# Patient Record
Sex: Female | Born: 1975 | Race: White | Hispanic: No | Marital: Married | Smoking: Light tobacco smoker
Health system: Southern US, Community
[De-identification: ages and names within clinical notes are randomized; demographics above are authoritative.]

## PROBLEM LIST (undated history)

## (undated) DIAGNOSIS — N6320 Unspecified lump in the left breast, unspecified quadrant: Secondary | ICD-10-CM

## (undated) DIAGNOSIS — E0590 Thyrotoxicosis, unspecified without thyrotoxic crisis or storm: Secondary | ICD-10-CM

## (undated) DIAGNOSIS — Z1371 Encounter for nonprocreative screening for genetic disease carrier status: Secondary | ICD-10-CM

## (undated) DIAGNOSIS — K219 Gastro-esophageal reflux disease without esophagitis: Secondary | ICD-10-CM

## (undated) DIAGNOSIS — Z803 Family history of malignant neoplasm of breast: Secondary | ICD-10-CM

## (undated) DIAGNOSIS — E559 Vitamin D deficiency, unspecified: Secondary | ICD-10-CM

## (undated) HISTORY — DX: Family history of malignant neoplasm of breast: Z80.3

## (undated) HISTORY — DX: Vitamin D deficiency, unspecified: E55.9

## (undated) HISTORY — DX: Thyrotoxicosis, unspecified without thyrotoxic crisis or storm: E05.90

## (undated) HISTORY — PX: BUNIONECTOMY: SHX129

## (undated) HISTORY — DX: Encounter for nonprocreative screening for genetic disease carrier status: Z13.71

## (undated) HISTORY — PX: BREAST EXCISIONAL BIOPSY: SUR124

## (undated) HISTORY — DX: Unspecified lump in the left breast, unspecified quadrant: N63.20

---

## 2004-09-15 ENCOUNTER — Emergency Department: Payer: Self-pay | Admitting: Emergency Medicine

## 2004-09-15 ENCOUNTER — Other Ambulatory Visit: Payer: Self-pay

## 2005-02-14 ENCOUNTER — Emergency Department: Payer: Self-pay | Admitting: Emergency Medicine

## 2005-04-29 ENCOUNTER — Inpatient Hospital Stay: Payer: Self-pay

## 2006-07-18 ENCOUNTER — Emergency Department: Payer: Self-pay | Admitting: Emergency Medicine

## 2009-07-10 ENCOUNTER — Encounter (HOSPITAL_COMMUNITY): Admission: RE | Admit: 2009-07-10 | Discharge: 2009-08-07 | Payer: Self-pay | Admitting: Endocrinology

## 2011-02-12 LAB — HCG, SERUM, QUALITATIVE: Preg, Serum: NEGATIVE

## 2014-11-05 ENCOUNTER — Ambulatory Visit: Payer: Self-pay | Admitting: Podiatry

## 2016-11-08 DIAGNOSIS — N6320 Unspecified lump in the left breast, unspecified quadrant: Secondary | ICD-10-CM

## 2016-11-08 HISTORY — DX: Unspecified lump in the left breast, unspecified quadrant: N63.20

## 2017-01-10 ENCOUNTER — Ambulatory Visit (INDEPENDENT_AMBULATORY_CARE_PROVIDER_SITE_OTHER): Payer: 59 | Admitting: Obstetrics and Gynecology

## 2017-01-10 ENCOUNTER — Encounter: Payer: Self-pay | Admitting: Obstetrics and Gynecology

## 2017-01-10 VITALS — BP 100/60 | HR 77 | Ht 65.0 in | Wt 144.0 lb

## 2017-01-10 DIAGNOSIS — Z1231 Encounter for screening mammogram for malignant neoplasm of breast: Secondary | ICD-10-CM

## 2017-01-10 DIAGNOSIS — Z1239 Encounter for other screening for malignant neoplasm of breast: Secondary | ICD-10-CM

## 2017-01-10 DIAGNOSIS — Z803 Family history of malignant neoplasm of breast: Secondary | ICD-10-CM | POA: Diagnosis not present

## 2017-01-10 DIAGNOSIS — N63 Unspecified lump in unspecified breast: Secondary | ICD-10-CM | POA: Diagnosis not present

## 2017-01-10 DIAGNOSIS — N644 Mastodynia: Secondary | ICD-10-CM

## 2017-01-10 NOTE — Progress Notes (Signed)
HPI: Pt presents for LT breast pain and mass for the past 3 days. The pain was severe, burning and sharp initially, but it has improved since. She then noticed a mass in the area yesterday. She denies any erythema, nipple d/c, trauma. She has a FH of breast cancer in her PGM and possibly in mat aunts, but is unsure. She has never had a mammo.    Review of Systems  Constitutional: Negative.   Skin: Negative.   Breast: mass, tenderness, no erythema/lesions  Vitals:   01/10/17 0949  BP: 100/60  Pulse: 77    Physical Exam  Constitutional: She appears well-developed.  Pulmonary/Chest: Left breast exhibits mass and tenderness.    Neurological: She is alert.  Vitals reviewed.     Assessment/Plan:  Breast mass - LT breast with 1.5 x 2 cm firm, mobile mass at 3:00 pos. Check dx mammo bilat and u/s.  - Plan: MM Digital Diagnostic Bilat, US BREAST LTD UNI LEFT INC AXILLA  Breast pain - Plan: MM Digital Diagnostic Bilat, US BREAST LTD UNI LEFT INC AXILLA  Screening for breast cancer - Plan: US BREAST LTD UNI RIGHT INC AXILLA  Family history of breast cancer - Pt to clarify mat FH of breast cancer and f/u next month at annual for BRCA testing review.  Return in about 6 weeks (around 02/21/2017), or if symptoms worsen or fail to improve, for annual.   Ardeth Perfect, PA-C 06/16/1693 6:56 PM

## 2017-01-11 ENCOUNTER — Telehealth: Payer: Self-pay | Admitting: Obstetrics and Gynecology

## 2017-01-11 NOTE — Telephone Encounter (Signed)
Patient is aware of appt at [facility] [place] on Friday, 01/14/17, 8:50am arrival for 9:10am appt. Patient was given the address and directions.

## 2017-01-11 NOTE — Telephone Encounter (Signed)
-----   Message from Chad Cordial, PA-C sent at 01/10/2017 10:12 AM EST ----- Dx mammo and u/s LT breast for 3:00 mass. Prefers AM appt. Send to [place] if can't get into [facility] in next wk. Thx.

## 2017-01-14 ENCOUNTER — Ambulatory Visit
Admission: RE | Admit: 2017-01-14 | Discharge: 2017-01-14 | Disposition: A | Discharge status: Home / Self Care | Payer: 59 | Source: Ambulatory Visit | Attending: Obstetrics and Gynecology | Admitting: Obstetrics and Gynecology

## 2017-01-14 ENCOUNTER — Other Ambulatory Visit: Payer: Self-pay | Admitting: Obstetrics and Gynecology

## 2017-01-14 ENCOUNTER — Encounter: Payer: Self-pay | Admitting: Obstetrics and Gynecology

## 2017-01-14 DIAGNOSIS — N63 Unspecified lump in unspecified breast: Secondary | ICD-10-CM

## 2017-01-14 DIAGNOSIS — N644 Mastodynia: Secondary | ICD-10-CM

## 2017-01-14 DIAGNOSIS — D242 Benign neoplasm of left breast: Secondary | ICD-10-CM

## 2017-01-17 ENCOUNTER — Other Ambulatory Visit: Payer: Self-pay | Admitting: Obstetrics and Gynecology

## 2017-01-17 ENCOUNTER — Telehealth: Payer: Self-pay | Admitting: Obstetrics and Gynecology

## 2017-01-17 DIAGNOSIS — N6320 Unspecified lump in the left breast, unspecified quadrant: Secondary | ICD-10-CM

## 2017-01-17 NOTE — Telephone Encounter (Signed)
Patient is aware of 07/12/17 9:00am appt at the [facility] w/ 8:40am check-in.

## 2017-02-14 ENCOUNTER — Ambulatory Visit: Payer: 59 | Admitting: Obstetrics and Gynecology

## 2017-04-13 ENCOUNTER — Ambulatory Visit: Payer: 59 | Admitting: Obstetrics and Gynecology

## 2017-07-12 ENCOUNTER — Inpatient Hospital Stay: Admission: RE | Admit: 2017-07-12 | Payer: 59 | Source: Ambulatory Visit

## 2017-07-26 ENCOUNTER — Inpatient Hospital Stay: Admission: RE | Admit: 2017-07-26 | Payer: Self-pay | Source: Ambulatory Visit

## 2017-11-08 DIAGNOSIS — Z1371 Encounter for nonprocreative screening for genetic disease carrier status: Secondary | ICD-10-CM

## 2017-11-08 DIAGNOSIS — Z803 Family history of malignant neoplasm of breast: Secondary | ICD-10-CM

## 2017-11-08 HISTORY — DX: Family history of malignant neoplasm of breast: Z80.3

## 2017-11-08 HISTORY — DX: Encounter for nonprocreative screening for genetic disease carrier status: Z13.71

## 2017-12-05 ENCOUNTER — Encounter: Payer: Self-pay | Admitting: Obstetrics and Gynecology

## 2017-12-05 ENCOUNTER — Ambulatory Visit (INDEPENDENT_AMBULATORY_CARE_PROVIDER_SITE_OTHER): Payer: 59 | Admitting: Obstetrics and Gynecology

## 2017-12-05 VITALS — BP 118/74 | Ht 64.0 in | Wt 175.0 lb

## 2017-12-05 DIAGNOSIS — Z01419 Encounter for gynecological examination (general) (routine) without abnormal findings: Secondary | ICD-10-CM | POA: Diagnosis not present

## 2017-12-05 DIAGNOSIS — Z808 Family history of malignant neoplasm of other organs or systems: Secondary | ICD-10-CM | POA: Diagnosis not present

## 2017-12-05 DIAGNOSIS — D242 Benign neoplasm of left breast: Secondary | ICD-10-CM

## 2017-12-05 DIAGNOSIS — Z1151 Encounter for screening for human papillomavirus (HPV): Secondary | ICD-10-CM

## 2017-12-05 DIAGNOSIS — Z803 Family history of malignant neoplasm of breast: Secondary | ICD-10-CM | POA: Diagnosis not present

## 2017-12-05 DIAGNOSIS — Z1231 Encounter for screening mammogram for malignant neoplasm of breast: Secondary | ICD-10-CM | POA: Diagnosis not present

## 2017-12-05 DIAGNOSIS — R928 Other abnormal and inconclusive findings on diagnostic imaging of breast: Secondary | ICD-10-CM | POA: Diagnosis not present

## 2017-12-05 DIAGNOSIS — Z713 Dietary counseling and surveillance: Secondary | ICD-10-CM

## 2017-12-05 DIAGNOSIS — Z124 Encounter for screening for malignant neoplasm of cervix: Secondary | ICD-10-CM

## 2017-12-05 DIAGNOSIS — Z1239 Encounter for other screening for malignant neoplasm of breast: Secondary | ICD-10-CM

## 2017-12-05 MED ORDER — PHENTERMINE HCL 37.5 MG PO TABS: 37.5000 mg | ORAL_TABLET | Freq: Every day | ORAL | 1 refills | Status: DC

## 2017-12-05 NOTE — Patient Instructions (Signed)
I value your feedback and entrusting us with your care. If you get a [facility] patient survey, I would appreciate you taking the time to let us know about your experience today. Thank you! 

## 2017-12-05 NOTE — Progress Notes (Signed)
PCP:  Patient, No Pcp Per   Chief Complaint  Patient presents with  . Annual Exam     HPI:      [name] is a 42 y.o. [phone] who LMP was Patient's last menstrual period was 11/10/2017., presents today for her annual examination.  Her menses are regular every 28-30 days, lasting 3 days.  Dysmenorrhea mild, occurring first 1-2 days of flow. She does not have intermenstrual bleeding.  Sex activity: single partner, contraception - vasectomy.  Last Pap: December 04, 2014  Results were: no abnormalities /neg HPV DNA  Hx of STDs: none  Last mammogram: January 14, 2017  Results were: birads 3 for LT breast, probable fibroadenoma. Repeat u/s due in 6 months but pt never had it done. Due for yearly mammo 3/19.  There is a FH of breast cancer in her PGM, genetic testing not done. There is no FH of ovarian cancer. The patient does do self-breast exams.  Tobacco use: The patient denies current or previous tobacco use. Alcohol use: social drinker No drug use.  Exercise: minimally active  She does get adequate calcium and Vitamin D in her diet.  Pt stopped smoking last yr and has gained wt since then. She is really upset about her current weight. She has tried keto diet but would like to try phentermine again to help her "jump start" wt loss. She did it in past for a few months with some wt loss.    Past Medical History:  Diagnosis Date  . Hyperthyroidism   . Pre-diabetes   . Vitamin D deficiency     Past Surgical History:  Procedure Laterality Date  . BUNIONECTOMY    . COSMETIC SURGERY      Family History  Problem Relation Age of Onset  . Lung cancer Maternal Grandmother   . Lung cancer Maternal Grandfather   . Breast cancer Paternal Grandmother 53  . Lung cancer Paternal Grandmother   . Diabetes Paternal Grandfather     Social History   Socioeconomic History  . Marital status: Married    Spouse name: Not on file  . Number of children: Not on file  . Years of  education: Not on file  . Highest education level: Not on file  Social Needs  . Financial resource strain: Not on file  . Food insecurity - worry: Not on file  . Food insecurity - inability: Not on file  . Transportation needs - medical: Not on file  . Transportation needs - non-medical: Not on file  Occupational History  . Not on file  Tobacco Use  . Smoking status: Former Research scientist (life sciences)  . Smokeless tobacco: Never Used  . Tobacco comment: quit 07/2017  Substance and Sexual Activity  . Alcohol use: Yes  . Drug use: No  . Sexual activity: Yes    Birth control/protection: Surgical    Comment: vasectomy  Other Topics Concern  . Not on file  Social History Narrative  . Not on file    Current Meds  Medication Sig  . levothyroxine (SYNTHROID, LEVOTHROID) 125 MCG tablet Take by mouth.  Marland Kitchen omeprazole (PRILOSEC) 40 MG capsule      ROS:  Review of Systems  Constitutional: Negative for fatigue, fever and unexpected weight change.  Respiratory: Negative for cough, shortness of breath and wheezing.   Cardiovascular: Negative for chest pain, palpitations and leg swelling.  Gastrointestinal: Positive for constipation. Negative for blood in stool, diarrhea, nausea and vomiting.  Endocrine: Negative for cold  intolerance, heat intolerance and polyuria.  Genitourinary: Negative for dyspareunia, dysuria, flank pain, frequency, genital sores, hematuria, menstrual problem, pelvic pain, urgency, vaginal bleeding, vaginal discharge and vaginal pain.  Musculoskeletal: Negative for back pain, joint swelling and myalgias.  Skin: Negative for rash.  Neurological: Negative for dizziness, syncope, light-headedness, numbness and headaches.  Hematological: Negative for adenopathy.  Psychiatric/Behavioral: Negative for agitation, confusion, sleep disturbance and suicidal ideas. The patient is not nervous/anxious.      Objective: BP 118/74   Ht 5\' 4"  (1.626 m)   Wt 175 lb (79.4 kg)   LMP 11/10/2017   BMI  30.04 kg/m    Physical Exam  Constitutional: She is oriented to person, place, and time. She appears well-developed and well-nourished.  Genitourinary: Vagina normal and uterus normal. There is no rash or tenderness on the right labia. There is no rash or tenderness on the left labia. No erythema or tenderness in the vagina. No vaginal discharge found. Right adnexum does not display mass and does not display tenderness. Left adnexum does not display mass and does not display tenderness. Cervix does not exhibit motion tenderness or polyp. Uterus is not enlarged or tender.  Neck: Normal range of motion. No thyromegaly present.  Cardiovascular: Normal rate, regular rhythm and normal heart sounds.  No murmur heard. Pulmonary/Chest: Effort normal and breath sounds normal. Right breast exhibits no mass, no nipple discharge, no skin change and no tenderness. Left breast exhibits no mass, no nipple discharge, no skin change and no tenderness.  Abdominal: Soft. There is no tenderness. There is no guarding.  Musculoskeletal: Normal range of motion.  Neurological: She is alert and oriented to person, place, and time. No cranial nerve deficit.  Psychiatric: She has a normal mood and affect. Her behavior is normal.  Vitals reviewed.   Assessment/Plan: Encounter for annual routine gynecological examination  Cervical cancer screening - Plan: IGP, Aptima HPV  Screening for HPV (human papillomavirus) - Plan: IGP, Aptima HPV  Screening for breast cancer - Pt to sched mammo. Will let us know if needs dx mammo due to LT breast f/u.  - Plan: MM DIGITAL SCREENING BILATERAL  Abnormal mammogram of left breast  Breast fibroadenoma, left - Lt breast u/s order in system.   Family history of breast cancer - MyRisk testing discussed and done today. RTO in 6 wks for results f/u. - Plan: Integrated BRACAnalysis  Weight loss counseling, encounter for - Rx phentermine for 2 months. Can do 1/2 to 1 tab QAM.  MyFitnessPal with 1500 cal day diet/increase exercise. - Plan: phentermine (ADIPEX-P) 37.5 MG tablet  Meds ordered this encounter  Medications  . phentermine (ADIPEX-P) 37.5 MG tablet    Sig: Take 1 tablet (37.5 mg total) by mouth daily before breakfast.    Dispense:  30 tablet    Refill:  1             GYN counsel breast self exam, mammography screening, adequate intake of calcium and vitamin D, diet and exercise     F/U  Return in about 6 weeks (around 01/16/2018) for [facility] results f/u.  Tanayah Squitieri B. Neeko Pharo, PA-C 12/05/2017 4:30 PM

## 2017-12-08 LAB — IGP, APTIMA HPV
HPV Aptima: NEGATIVE
PAP Smear Comment: 0

## 2017-12-15 ENCOUNTER — Encounter: Payer: Self-pay | Admitting: Obstetrics and Gynecology

## 2017-12-19 ENCOUNTER — Encounter: Payer: Self-pay | Admitting: Obstetrics and Gynecology

## 2017-12-19 ENCOUNTER — Ambulatory Visit (INDEPENDENT_AMBULATORY_CARE_PROVIDER_SITE_OTHER): Payer: 59 | Admitting: Obstetrics and Gynecology

## 2017-12-19 VITALS — BP 100/70 | HR 74 | Ht 64.0 in | Wt 168.0 lb

## 2017-12-19 DIAGNOSIS — Z713 Dietary counseling and surveillance: Secondary | ICD-10-CM

## 2017-12-19 DIAGNOSIS — Z1371 Encounter for nonprocreative screening for genetic disease carrier status: Secondary | ICD-10-CM

## 2017-12-19 DIAGNOSIS — Z7183 Encounter for nonprocreative genetic counseling: Secondary | ICD-10-CM

## 2017-12-19 NOTE — Progress Notes (Signed)
   Chief Complaint  Patient presents with  . Results    HPI:   [name] is a 42 y.o. [phone] who LMP was Patient's last menstrual period was 12/10/2017., presents today for  My Risk cancer genetic testing results.  Results are negative for a cancer genetic mutation.  IBIS=9.3 % Riskscore=13.9 % Last mammogram: 3/18; pt due Vitamin D status: takes supp  Pt also here to f/u on wt loss with phentermine, started at 12/05/17 appt. Doing well. Takes 1/2 tab daily. Walking 3-5 mi a day. Has lost ~7# in about 2 wks. Modifying diet. No side effects.   Patient Active Problem List   Diagnosis Date Noted  . Breast fibroadenoma in female, left 01/14/2017    Family History  Problem Relation Age of Onset  . Lung cancer Maternal Grandmother   . Lung cancer Maternal Grandfather   . Breast cancer Paternal Grandmother 25  . Lung cancer Paternal Grandmother   . Diabetes Paternal Grandfather    Vitals:   12/19/17 0826  Weight: 168 lb (76.2 kg)  Height: '5\' 4"'$  (1.626 m)    Assessment/Plan: Encounter for nonprocreative genetic counseling  BRCA negative  Weight loss counseling, encounter for - Cont phentermine for a few more wks, then go off to see how she does. Cont diet/exercise changes. Good job with wt loss.    Recommended monthly SBE, yearly CBE, yearly mammos and to continue Vit D3 2000 IU dialy. Pt due for mammo and plans to sched.   Patient understands these results only apply to her and her children, and this is not indicative of genetic testing results of her other family members. It is recommended that her other family members have genetic testing done.  Pt also understands negative genetic testing doesn't mean she will never get any of these cancers.   Results handout given to pt.  45  Min spent in direct contact with pt re: counseling/coordination of care.  Joniah Bednarski B. Afra Tricarico, PA-C  12/19/2017 9:09 AM

## 2017-12-19 NOTE — Patient Instructions (Signed)
I value your feedback and entrusting us with your care. If you get a [facility] patient survey, I would appreciate you taking the time to let us know about your experience today. Thank you! 

## 2018-06-16 ENCOUNTER — Telehealth: Payer: Self-pay

## 2018-06-16 DIAGNOSIS — Z1239 Encounter for other screening for malignant neoplasm of breast: Secondary | ICD-10-CM

## 2018-06-16 DIAGNOSIS — D242 Benign neoplasm of left breast: Secondary | ICD-10-CM

## 2018-06-16 NOTE — Telephone Encounter (Signed)
Pt could like [place] to ask to scheduled her sooner with [place] [facility], she states the mass on her breast has grown but they told her to call us and have [place] call to get her in sooner. Pt told [facility] out of office until Monday and she is okay with it, just would like to be contacted with the information

## 2018-06-18 ENCOUNTER — Encounter: Payer: Self-pay | Admitting: Obstetrics and Gynecology

## 2018-06-19 NOTE — Addendum Note (Signed)
Addended by: Ardeth Perfect B on: 4/37/0052 04:37 PM   Modules accepted: Orders

## 2018-06-19 NOTE — Telephone Encounter (Signed)
Pt notes that LT breast mass is now larger. Never had u/s f/u 9/18 for probable fibroadenoma. Pt has also lost over 25# so mass could be more prominent. Due for bilat mammo 3/19. Wants to sched at [facility] since closer than [place] [facility].

## 2018-06-23 ENCOUNTER — Ambulatory Visit
Admission: RE | Admit: 2018-06-23 | Discharge: 2018-06-23 | Disposition: A | Discharge status: Home / Self Care | Payer: 59 | Source: Ambulatory Visit | Attending: Obstetrics and Gynecology | Admitting: Obstetrics and Gynecology

## 2018-06-23 ENCOUNTER — Encounter: Payer: Self-pay | Admitting: Radiology

## 2018-06-23 ENCOUNTER — Other Ambulatory Visit: Payer: Self-pay | Admitting: Obstetrics and Gynecology

## 2018-06-23 DIAGNOSIS — D242 Benign neoplasm of left breast: Secondary | ICD-10-CM | POA: Diagnosis not present

## 2018-06-23 DIAGNOSIS — Z1239 Encounter for other screening for malignant neoplasm of breast: Secondary | ICD-10-CM

## 2018-06-23 DIAGNOSIS — N6323 Unspecified lump in the left breast, lower outer quadrant: Secondary | ICD-10-CM | POA: Diagnosis not present

## 2018-06-23 DIAGNOSIS — R922 Inconclusive mammogram: Secondary | ICD-10-CM | POA: Diagnosis not present

## 2018-06-23 DIAGNOSIS — Z1231 Encounter for screening mammogram for malignant neoplasm of breast: Secondary | ICD-10-CM | POA: Diagnosis present

## 2018-06-23 DIAGNOSIS — R928 Other abnormal and inconclusive findings on diagnostic imaging of breast: Secondary | ICD-10-CM

## 2018-06-30 ENCOUNTER — Ambulatory Visit
Admission: RE | Admit: 2018-06-30 | Discharge: 2018-06-30 | Disposition: A | Discharge status: Home / Self Care | Payer: 59 | Source: Ambulatory Visit | Attending: Obstetrics and Gynecology | Admitting: Obstetrics and Gynecology

## 2018-06-30 DIAGNOSIS — R928 Other abnormal and inconclusive findings on diagnostic imaging of breast: Secondary | ICD-10-CM | POA: Diagnosis present

## 2018-06-30 DIAGNOSIS — N3001 Acute cystitis with hematuria: Secondary | ICD-10-CM | POA: Diagnosis not present

## 2018-06-30 DIAGNOSIS — R300 Dysuria: Secondary | ICD-10-CM | POA: Diagnosis not present

## 2018-06-30 DIAGNOSIS — N6323 Unspecified lump in the left breast, lower outer quadrant: Secondary | ICD-10-CM | POA: Diagnosis not present

## 2018-07-03 LAB — SURGICAL PATHOLOGY

## 2018-07-04 ENCOUNTER — Encounter: Payer: Self-pay | Admitting: Obstetrics and Gynecology

## 2018-07-04 ENCOUNTER — Telehealth: Payer: Self-pay | Admitting: Obstetrics and Gynecology

## 2018-07-04 DIAGNOSIS — R897 Abnormal histological findings in specimens from other organs, systems and tissues: Secondary | ICD-10-CM

## 2018-07-04 NOTE — Telephone Encounter (Signed)
Pt aware of breast bx results. Pt with fibroepithelial cells with atypia. Needs gen surg ref for exc.   Recent Results (from the past 2160 hour(s))  Surgical pathology     Status: None   Collection Time: 06/30/18  8:39 AM  Result Value Ref Range   SURGICAL PATHOLOGY      Surgical Pathology CASE: [id number] PATIENT: Carolyn Nelson Surgical Pathology Report     SPECIMEN SUBMITTED: A. Breast, left  CLINICAL HISTORY: Left oval hypoechoic breast mass; by ultrasound it measures 3.5 x 1.8 x 2.9 cm (it measured 2.2 cm in March 2018)  PRE-OPERATIVE DIAGNOSIS: Fibroadenoma vs phyllodes vs CA  POST-OPERATIVE DIAGNOSIS: None provided.     DIAGNOSIS: A. BREAST, LEFT 3:30 3CMFN; ULTRASOUND-GUIDED BIOPSY: - FIBROEPITHELIAL LESION, SEE COMMENT.  Comment: Sections demonstrate cores of a fibroepithelial lesion with intracanalicular architecture and mildly cellular myxoid stroma. Focal and mild stromal cytologic atypia is noted. Mitotic figures are not increased. The differential diagnosis includes a fibroadenoma with intracanalicular growth pattern and phyllodes tumor. Malignancy is not identified in the material sampled. These findings may not be representative of a larger lesion and excision is advised for definitive classification.    GROSS DESCRIPTION: A. Labeled: Left breast 3:30 3 cm from nipple Received: Formalin Time/date in fixative: Collected and placed into formalin at 8:34 AM on 06/30/2018 Cold ischemic time: Less than 1 minute Total fixation time: 9 hours Core pieces: Multiple Size: Aggregate, 1.7 x 1.0 x 0.3 cm Description: Multiple, disrupted and friable needle core biopsy fragments of pale-tan soft tissue Ink color: Blue Entirely submitted in 1-2 cassettes.     Final Diagnosis performed by Quay Burow, MD.   Electronically signed 07/03/2018 3:31:09PM The electronic signature indicates that the named Attending Pathologist has evaluated the specimen  Technical component performed at [facility], [address], [place], [place] [address] Lab: [phone] Dir: Rush Farmer, MD, MMM  Professional component performed at [facility], [place], [facility], [place], [place],  [address] Lab: [phone] Dir: Dellia Nims. Reuel Derby, MD

## 2018-07-06 ENCOUNTER — Telehealth: Payer: Self-pay

## 2018-07-06 NOTE — Telephone Encounter (Signed)
Pt aware.

## 2018-07-06 NOTE — Telephone Encounter (Signed)
Pt called triage wondering if there has been any arrangements for her breast biopsy she has not got any phone calls, please advise

## 2018-07-06 NOTE — Telephone Encounter (Signed)
Please advise 

## 2018-07-06 NOTE — Telephone Encounter (Signed)
Pt calling about gen surg ref. Izora Gala out today, will work on it tomorrow. RN to notify pt.

## 2018-07-11 ENCOUNTER — Other Ambulatory Visit: Payer: Self-pay | Admitting: Obstetrics and Gynecology

## 2018-07-11 ENCOUNTER — Telehealth: Payer: Self-pay

## 2018-07-11 MED ORDER — FLUCONAZOLE 150 MG PO TABS: 150.0000 mg | ORAL_TABLET | Freq: Once | ORAL | 0 refills | Status: AC

## 2018-07-11 NOTE — Telephone Encounter (Signed)
Pt calling today [facility] and they gave her antibiotic but now she feels like she has a yeast infection and wondering if [facility] will send in RX or does she want her to come in to be seen?

## 2018-07-11 NOTE — Telephone Encounter (Signed)
Rx diflucan eRxd. F/u prn sx.

## 2018-07-11 NOTE — Telephone Encounter (Signed)
Pt aware.

## 2018-07-11 NOTE — Progress Notes (Signed)
Rx for yeast vag sx with abx use.

## 2018-07-14 ENCOUNTER — Encounter: Payer: Self-pay | Admitting: Surgery

## 2018-07-14 ENCOUNTER — Ambulatory Visit (INDEPENDENT_AMBULATORY_CARE_PROVIDER_SITE_OTHER): Payer: 59 | Admitting: Surgery

## 2018-07-14 ENCOUNTER — Telehealth: Payer: Self-pay

## 2018-07-14 VITALS — BP 98/64 | HR 72 | Temp 97.7°F | Resp 18 | Ht 64.0 in | Wt 146.0 lb

## 2018-07-14 DIAGNOSIS — D242 Benign neoplasm of left breast: Secondary | ICD-10-CM

## 2018-07-14 NOTE — Telephone Encounter (Signed)
See if this is appropriate Monday am

## 2018-07-14 NOTE — Patient Instructions (Addendum)
We will schedule you for a Left Breast Lumpectomy  07/28/18 with Dr.Piscoya at [facility] will pre admit via phone. Surgery information has been given.

## 2018-07-14 NOTE — Progress Notes (Signed)
07/14/2018  Reason for Visit:  Left breast mass  Referring Provider:  Ardeth Perfect, PA-C  History of Present Illness: Carolyn Nelson is a 42 y.o. female referred by Carolyn Nelson for evaluation of a left breast mass.  The patient has had a mass in the left breast that she noticed on self breast exam.  She had a mammogram in 01/2017 and was diagnosed with a fibroadenoma, measuring 2.2 cm.  She did not have a follow up mammogram, but a few weeks ago, she noticed that the mass had grown.  She had a repeat mammogram on 06/23/18 and it found that mass had grown to 3.5 cm x 1.8 x 2.9.  She had a biopsy which showed fibroepithelial lesion, with differential of fibroadenoma with intracanalicular growth pattern and phyllodes tumor.  Given the growth of the mass, excision was recommended.    The patient reports feeling the mass and having some bruising after the biopsy.  She denies otherwise any skin changes, nipple changes, retraction, drainage/discharge, or pain.  She thinks there is history of breast cancer in father's grandmother but is unsure.  Past Medical History: Past Medical History:  Diagnosis Date  . BRCA negative 11/2017   MyRisk neg  . Family history of breast cancer 11/2017   IBIS=13.9%/riskscore=9.3%  . Hyperthyroidism   . Left breast mass 2018   8/19 fiborepithelial lesion with atypia  . Pre-diabetes   . Vitamin D deficiency      Past Surgical History: Past Surgical History:  Procedure Laterality Date  . BUNIONECTOMY    . COSMETIC SURGERY      Home Medications: Prior to Admission medications   Medication Sig Start Date End Date Taking? Authorizing Provider  levothyroxine (SYNTHROID, LEVOTHROID) 125 MCG tablet Take by mouth.    [provider]    Allergies: Allergies  Allergen Reactions  . Celecoxib Swelling    Social History:  reports that she has quit smoking. She has never used smokeless tobacco. She reports that she drinks alcohol. She reports that she  does not use drugs.   Family History: Family History  Problem Relation Age of Onset  . Lung cancer Maternal Grandmother   . Lung cancer Maternal Grandfather   . Breast cancer Paternal Grandmother 29  . Lung cancer Paternal Grandmother   . Diabetes Paternal Grandfather     Review of Systems: Review of Systems  Constitutional: Negative for chills and fever.  HENT: Negative for hearing loss.   Eyes: Negative for blurred vision.  Respiratory: Negative for shortness of breath.   Cardiovascular: Negative for chest pain.  Gastrointestinal: Negative for abdominal pain.  Genitourinary: Negative for dysuria.  Musculoskeletal: Negative for myalgias.  Skin: Negative for rash.  Neurological: Negative for dizziness.  Psychiatric/Behavioral: Negative for depression.    Physical Exam BP 98/64   Pulse 72   Temp 97.7 F (36.5 C) (Temporal)   Resp 18   Ht '5\' 4"'  (1.626 m)   Wt 146 lb (66.2 kg)   LMP 06/22/2018   BMI 25.06 kg/m  CONSTITUTIONAL: No acute distress HEENT:  Normocephalic, atraumatic, extraocular motion intact. NECK: Trachea is midline, and there is no jugular venous distension.  RESPIRATORY:  Lungs are clear, and breath sounds are equal bilaterally. Normal respiratory effort without pathologic use of accessory muscles. CARDIOVASCULAR: Heart is regular without murmurs, gallops, or rubs. BREAST:  No palpable masses on the right breast, and no skin/nipple changes.  No lymphadenopathy on right axilla.  On the left, there is a easily  palpable mass, measuring about 3 cm size at 3 o clock, about 4 cm from nipple.  No lymphadenopathy.  No nipple/skin changes. GI: The abdomen is soft, nondistended, nontender.  MUSCULOSKELETAL:  Normal muscle strength and tone in all four extremities.  No peripheral edema or cyanosis. SKIN: Skin turgor is normal. There are no pathologic skin lesions.  NEUROLOGIC:  Motor and sensation is grossly normal.  Cranial nerves are grossly intact. PSYCH:  Alert  and oriented to person, place and time. Affect is normal.  Laboratory Analysis: Pathology 06/30/18: DIAGNOSIS:  A. BREAST, LEFT 3:30 3CMFN; ULTRASOUND-GUIDED BIOPSY:  - FIBROEPITHELIAL LESION, SEE COMMENT.   Comment:  Sections demonstrate cores of a fibroepithelial lesion with  intracanalicular architecture and mildly cellular myxoid stroma. Focal and mild stromal cytologic atypia is noted. Mitotic figures are not increased. The differential diagnosis includes a fibroadenoma with intracanalicular growth pattern and phyllodes tumor. Malignancy is not  identified in the material sampled. These findings may not be representative of a larger lesion and excision is advised for definitive classification.   Imaging: Mammogram/US 06/23/18: Cc and MLO views of bilateral breasts are submitted. There is a mass in the upper-outer quadrant left breast. The right breasts is negative.  Mammographic images were processed with CAD.  Targeted ultrasound is performed, showing 3.5 x 1.8 x 2.9 cm oval hypoechoic mass with through transmission at the left breast 3:30 o'clock 3 cm from nipple enlarged compared to prior ultrasound. Ultrasound the left axilla is negative.  IMPRESSION: Suspicious findings.  Assessment and Plan: This is a 42 y.o. female who presents with a left breast mass, with biopsy showing possible fibroadenoma vs phyllodes tumor, but with a significant growth over the past year and a half.  I have independently viewed the patient's imaging studies and reviewed her pathology.  Discussed with the patient that though at this point this appears to be a benign mass, the significant growth since last year warrants excision.  She is in agreement with this and wishes to proceed.  Discussed with her the role of a lumpectomy and given that the mass is easily palpable, there is no need for wire localization.  Her breast size is relatively small, and I did have a discussion with her regarding the possible  cosmetic defect after removing the mass.  She would rather not proceed with any more aggressive surgical options including mastectomy and reconstruction, and would rather see how the comestic result of a lumpectomy does and then if she wants, get a referral to plastic surgery in the future.  This is a very reasonable approach.  Discussed the risks of bleeding, infection, and injury to surrounding structures.  She wishes to schedule her surgery on a Friday, and we'll schedule her for 07/28/18.    Melvyn Neth, [facility]

## 2018-07-14 NOTE — Telephone Encounter (Signed)
Pt wants [facility] to call her back today.  She saw surgeon today and has questions for [facility].  [phone]

## 2018-07-14 NOTE — Telephone Encounter (Signed)
Patient calling back to office asking for referral to [facility] [phone]) for a second opinion.  She did have referral appt today and says they gave her more information than she expected and just felt more comfortable if she could get second opinion.  She would like this ASAP.  Aware [facility] not in office today, asked if there was any way another provider could put in referral if she couldn't.

## 2018-07-17 ENCOUNTER — Other Ambulatory Visit: Payer: Self-pay | Admitting: Obstetrics and Gynecology

## 2018-07-17 DIAGNOSIS — R897 Abnormal histological findings in specimens from other organs, systems and tissues: Secondary | ICD-10-CM

## 2018-07-17 NOTE — Telephone Encounter (Signed)
Pls sched 2nd opinion with Dr. Donne Hazel. Needs before 07/28/18 for atypical cells on breast mass bx.

## 2018-07-17 NOTE — Progress Notes (Signed)
Pt wants 2nd opinion re: breast surg. Refer to Dr. Donne Hazel.

## 2018-07-18 NOTE — Telephone Encounter (Signed)
PT is waiting to hear from her referral to [facility]. CB# [phone]

## 2018-07-18 NOTE — Telephone Encounter (Signed)
Patient is aware of appointment w/ Dr. Donne Hazel at [facility] on Monday, 07/24/18 @ 1:50pm. Patient is aware to arrive @ 1:20pm at [address] [place], [place].

## 2018-07-18 NOTE — Telephone Encounter (Signed)
Patient is aware to pick up imaging disc at [facility] radiology desk, and that the disc will be ready in ~45 minutes per [facility] @ Radiology.

## 2018-07-21 ENCOUNTER — Other Ambulatory Visit: Payer: Self-pay

## 2018-07-21 ENCOUNTER — Encounter
Admission: RE | Admit: 2018-07-21 | Discharge: 2018-07-21 | Disposition: A | Discharge status: Home / Self Care | Payer: 59 | Source: Ambulatory Visit | Attending: Surgery | Admitting: Surgery

## 2018-07-21 HISTORY — DX: Gastro-esophageal reflux disease without esophagitis: K21.9

## 2018-07-21 NOTE — Patient Instructions (Signed)
Your procedure is scheduled on: 07/28/18 Fri Report to Same Day Surgery 2nd floor medical mall [facility] Entrance-take elevator on left to 2nd floor.  Check in with surgery information desk.) To find out your arrival time please call [phone] between 1PM - 3PM on 07/27/18 Thurs  Remember: Instructions that are not followed completely may result in serious medical risk, up to and including death, or upon the discretion of your surgeon and anesthesiologist your surgery may need to be rescheduled.    _x___ 1. Do not eat food after midnight the night before your procedure. You may drink clear liquids up to 2 hours before you are scheduled to arrive at the hospital for your procedure.  Do not drink clear liquids within 2 hours of your scheduled arrival to the hospital.  Clear liquids include  --Water or Apple juice without pulp  --Clear carbohydrate beverage such as ClearFast or Gatorade  --Black Coffee or Clear Tea (No milk, no creamers, do not add anything to                  the coffee or Tea Type 1 and type 2 diabetics should only drink water.   ____Ensure clear carbohydrate drink on the way to the hospital for bariatric patients  ____Ensure clear carbohydrate drink 3 hours before surgery for Dr Dwyane Luo patients if physician instructed.   No gum chewing or hard candies.     __x__ 2. No Alcohol for 24 hours before or after surgery.   __x__3. No Smoking or e-cigarettes for 24 prior to surgery.  Do not use any chewable tobacco products for at least 6 hour prior to surgery   ____  4. Bring all medications with you on the day of surgery if instructed.    __x__ 5. Notify your doctor if there is any change in your medical condition     (cold, fever, infections).    x___6. On the morning of surgery brush your teeth with toothpaste and water.  You may rinse your mouth with mouth wash if you wish.  Do not swallow any toothpaste or mouthwash.   Do not wear jewelry, make-up, hairpins,  clips or nail polish.  Do not wear lotions, powders, or perfumes. You may wear deodorant.  Do not shave 48 hours prior to surgery. Men may shave face and neck.  Do not bring valuables to the hospital.    [facility] is not responsible for any belongings or valuables.               Contacts, dentures or bridgework may not be worn into surgery.  Leave your suitcase in the car. After surgery it may be brought to your room.  For patients admitted to the hospital, discharge time is determined by your                       treatment team.  _  Patients discharged the day of surgery will not be allowed to drive home.  You will need someone to drive you home and stay with you the night of your procedure.    Please read over the following fact sheets that you were given:   [facility] Preparing for Surgery and or MRSA Information   _x___ Take anti-hypertensive listed below, cardiac, seizure, asthma,     anti-reflux and psychiatric medicines. These include:  1. levothyroxine (SYNTHROID, LEVOTHROID) 125 MCG tablet  2.omeprazole (PRILOSEC) 40 MG capsule  3.  4.  5.  6.  ____Fleets enema or Magnesium Citrate as directed.   _x___ Use CHG Soap or sage wipes as directed on instruction sheet   ____ Use inhalers on the day of surgery and bring to hospital day of surgery  ____ Stop Metformin and Janumet 2 days prior to surgery.    ____ Take 1/2 of usual insulin dose the night before surgery and none on the morning     surgery.   _x___ Follow recommendations from Cardiologist, Pulmonologist or PCP regarding          stopping Aspirin, Coumadin, Plavix ,Eliquis, Effient, or Pradaxa, and Pletal.  X____Stop Anti-inflammatories such as Advil, Aleve, Ibuprofen, Motrin, Naproxen, Naprosyn, Goodies powders or aspirin products. OK to take Tylenol and                          Celebrex.   _x___ Stop supplements until after surgery.  But may continue Vitamin D, Vitamin B,       and multivitamin.   ____ Bring  C-Pap to the hospital.

## 2018-07-24 ENCOUNTER — Other Ambulatory Visit: Payer: Self-pay | Admitting: General Surgery

## 2018-07-24 DIAGNOSIS — E559 Vitamin D deficiency, unspecified: Secondary | ICD-10-CM | POA: Diagnosis not present

## 2018-07-24 DIAGNOSIS — E890 Postprocedural hypothyroidism: Secondary | ICD-10-CM | POA: Diagnosis not present

## 2018-07-24 DIAGNOSIS — N6320 Unspecified lump in the left breast, unspecified quadrant: Secondary | ICD-10-CM | POA: Diagnosis not present

## 2018-07-24 DIAGNOSIS — R7301 Impaired fasting glucose: Secondary | ICD-10-CM | POA: Diagnosis not present

## 2018-07-25 ENCOUNTER — Telehealth: Payer: Self-pay | Admitting: Surgery

## 2018-07-25 NOTE — Telephone Encounter (Signed)
Patient has called and requested to cancel her surgery. Patient seen Dr Donne Hazel with [facility] for a second opinion and will be continuing care in [place]. Surgery has been cancelled for 07/28/18-Dr Piscoya-Left breast lumpectomy.  Patient did thank [place] for our help and all the information that provided to her during her consult.

## 2018-07-27 ENCOUNTER — Encounter (HOSPITAL_BASED_OUTPATIENT_CLINIC_OR_DEPARTMENT_OTHER): Payer: Self-pay | Admitting: *Deleted

## 2018-07-27 ENCOUNTER — Other Ambulatory Visit: Payer: Self-pay

## 2018-07-28 ENCOUNTER — Ambulatory Visit: Admission: RE | Admit: 2018-07-28 | Payer: 59 | Source: Ambulatory Visit | Admitting: Surgery

## 2018-07-28 ENCOUNTER — Encounter: Admission: RE | Payer: Self-pay | Source: Ambulatory Visit

## 2018-07-28 SURGERY — BREAST LUMPECTOMY
Anesthesia: General | Laterality: Left

## 2018-08-01 ENCOUNTER — Other Ambulatory Visit: Payer: Self-pay | Admitting: General Surgery

## 2018-08-02 NOTE — Progress Notes (Signed)
Ensure pre surgery drink given with instructions to complete by 0730 dos, pt verbalized understanding. 

## 2018-08-03 ENCOUNTER — Encounter (HOSPITAL_BASED_OUTPATIENT_CLINIC_OR_DEPARTMENT_OTHER): Payer: Self-pay | Admitting: Anesthesiology

## 2018-08-03 ENCOUNTER — Encounter (HOSPITAL_BASED_OUTPATIENT_CLINIC_OR_DEPARTMENT_OTHER)
Admission: RE | Disposition: A | Discharge status: Home / Self Care | Payer: Self-pay | Source: Ambulatory Visit | Attending: General Surgery

## 2018-08-03 ENCOUNTER — Ambulatory Visit (HOSPITAL_BASED_OUTPATIENT_CLINIC_OR_DEPARTMENT_OTHER): Payer: 59 | Admitting: Anesthesiology

## 2018-08-03 ENCOUNTER — Ambulatory Visit (HOSPITAL_BASED_OUTPATIENT_CLINIC_OR_DEPARTMENT_OTHER)
Admission: RE | Admit: 2018-08-03 | Discharge: 2018-08-03 | Disposition: A | Discharge status: Home / Self Care | Payer: 59 | Source: Ambulatory Visit | Attending: General Surgery | Admitting: General Surgery

## 2018-08-03 DIAGNOSIS — Z79899 Other long term (current) drug therapy: Secondary | ICD-10-CM | POA: Diagnosis not present

## 2018-08-03 DIAGNOSIS — E0590 Thyrotoxicosis, unspecified without thyrotoxic crisis or storm: Secondary | ICD-10-CM | POA: Insufficient documentation

## 2018-08-03 DIAGNOSIS — Z8261 Family history of arthritis: Secondary | ICD-10-CM | POA: Insufficient documentation

## 2018-08-03 DIAGNOSIS — K219 Gastro-esophageal reflux disease without esophagitis: Secondary | ICD-10-CM | POA: Insufficient documentation

## 2018-08-03 DIAGNOSIS — F17200 Nicotine dependence, unspecified, uncomplicated: Secondary | ICD-10-CM | POA: Diagnosis not present

## 2018-08-03 DIAGNOSIS — N6320 Unspecified lump in the left breast, unspecified quadrant: Secondary | ICD-10-CM | POA: Diagnosis present

## 2018-08-03 DIAGNOSIS — D242 Benign neoplasm of left breast: Secondary | ICD-10-CM | POA: Diagnosis not present

## 2018-08-03 HISTORY — PX: BREAST BIOPSY: SHX20

## 2018-08-03 LAB — POCT PREGNANCY, URINE: PREG TEST UR: NEGATIVE

## 2018-08-03 SURGERY — BREAST BIOPSY
Anesthesia: General | Site: Breast | Laterality: Left

## 2018-08-03 MED ORDER — LIDOCAINE 2% (20 MG/ML) 5 ML SYRINGE
INTRAMUSCULAR | Status: DC | PRN
  Administered 2018-08-03: 50 mg via INTRAVENOUS

## 2018-08-03 MED ORDER — DEXAMETHASONE SODIUM PHOSPHATE 4 MG/ML IJ SOLN
INTRAMUSCULAR | Status: DC | PRN
  Administered 2018-08-03: 10 mg via INTRAVENOUS

## 2018-08-03 MED ORDER — ACETAMINOPHEN 500 MG PO TABS
1000.0000 mg | ORAL_TABLET | ORAL | Status: AC
  Administered 2018-08-03: 1000 mg via ORAL

## 2018-08-03 MED ORDER — SCOPOLAMINE 1 MG/3DAYS TD PT72: 1.0000 | MEDICATED_PATCH | Freq: Once | TRANSDERMAL | Status: DC | PRN

## 2018-08-03 MED ORDER — FENTANYL CITRATE (PF) 100 MCG/2ML IJ SOLN
50.0000 ug | INTRAMUSCULAR | Status: DC | PRN
  Administered 2018-08-03 (×2): 50 ug via INTRAVENOUS

## 2018-08-03 MED ORDER — FENTANYL CITRATE (PF) 100 MCG/2ML IJ SOLN: 25.0000 ug | INTRAMUSCULAR | Status: DC | PRN

## 2018-08-03 MED ORDER — GABAPENTIN 100 MG PO CAPS
100.0000 mg | ORAL_CAPSULE | ORAL | Status: AC
  Administered 2018-08-03: 100 mg via ORAL

## 2018-08-03 MED ORDER — DEXAMETHASONE SODIUM PHOSPHATE 10 MG/ML IJ SOLN
INTRAMUSCULAR | Status: AC
  Filled 2018-08-03: qty 1

## 2018-08-03 MED ORDER — CEFAZOLIN SODIUM-DEXTROSE 2-4 GM/100ML-% IV SOLN
INTRAVENOUS | Status: AC
  Filled 2018-08-03: qty 100

## 2018-08-03 MED ORDER — MIDAZOLAM HCL 2 MG/2ML IJ SOLN
INTRAMUSCULAR | Status: AC
  Filled 2018-08-03: qty 2

## 2018-08-03 MED ORDER — BUPIVACAINE HCL (PF) 0.25 % IJ SOLN
INTRAMUSCULAR | Status: DC | PRN
  Administered 2018-08-03: 17 mL

## 2018-08-03 MED ORDER — MEPERIDINE HCL 25 MG/ML IJ SOLN: 6.2500 mg | INTRAMUSCULAR | Status: DC | PRN

## 2018-08-03 MED ORDER — CEFAZOLIN SODIUM-DEXTROSE 2-4 GM/100ML-% IV SOLN
2.0000 g | INTRAVENOUS | Status: AC
  Administered 2018-08-03: 2 g via INTRAVENOUS

## 2018-08-03 MED ORDER — FENTANYL CITRATE (PF) 100 MCG/2ML IJ SOLN
INTRAMUSCULAR | Status: AC
  Filled 2018-08-03: qty 2

## 2018-08-03 MED ORDER — OXYCODONE HCL 5 MG/5ML PO SOLN: 5.0000 mg | Freq: Once | ORAL | Status: DC | PRN

## 2018-08-03 MED ORDER — BUPIVACAINE HCL (PF) 0.25 % IJ SOLN
INTRAMUSCULAR | Status: AC
  Filled 2018-08-03: qty 30

## 2018-08-03 MED ORDER — GABAPENTIN 100 MG PO CAPS
ORAL_CAPSULE | ORAL | Status: AC
  Filled 2018-08-03: qty 1

## 2018-08-03 MED ORDER — TRAMADOL HCL 50 MG PO TABS: 50.0000 mg | ORAL_TABLET | Freq: Four times a day (QID) | ORAL | 1 refills | Status: DC | PRN

## 2018-08-03 MED ORDER — ONDANSETRON HCL 4 MG/2ML IJ SOLN
INTRAMUSCULAR | Status: DC | PRN
  Administered 2018-08-03: 4 mg via INTRAVENOUS

## 2018-08-03 MED ORDER — MIDAZOLAM HCL 2 MG/2ML IJ SOLN
1.0000 mg | INTRAMUSCULAR | Status: DC | PRN
  Administered 2018-08-03: 2 mg via INTRAVENOUS

## 2018-08-03 MED ORDER — LACTATED RINGERS IV SOLN
INTRAVENOUS | Status: DC
  Administered 2018-08-03: 11:00:00 via INTRAVENOUS

## 2018-08-03 MED ORDER — ENSURE PRE-SURGERY PO LIQD: 296.0000 mL | Freq: Once | ORAL | Status: DC

## 2018-08-03 MED ORDER — KETOROLAC TROMETHAMINE 15 MG/ML IJ SOLN
INTRAMUSCULAR | Status: AC
  Filled 2018-08-03: qty 1

## 2018-08-03 MED ORDER — ONDANSETRON HCL 4 MG/2ML IJ SOLN
INTRAMUSCULAR | Status: AC
  Filled 2018-08-03: qty 2

## 2018-08-03 MED ORDER — PROMETHAZINE HCL 25 MG/ML IJ SOLN: 6.2500 mg | INTRAMUSCULAR | Status: DC | PRN

## 2018-08-03 MED ORDER — LIDOCAINE HCL (PF) 1 % IJ SOLN
INTRAMUSCULAR | Status: AC
  Filled 2018-08-03: qty 30

## 2018-08-03 MED ORDER — PROPOFOL 10 MG/ML IV BOLUS
INTRAVENOUS | Status: DC | PRN
  Administered 2018-08-03: 150 mg via INTRAVENOUS

## 2018-08-03 MED ORDER — OXYCODONE HCL 5 MG PO TABS: 5.0000 mg | ORAL_TABLET | Freq: Once | ORAL | Status: DC | PRN

## 2018-08-03 MED ORDER — LIDOCAINE 2% (20 MG/ML) 5 ML SYRINGE
INTRAMUSCULAR | Status: AC
  Filled 2018-08-03: qty 5

## 2018-08-03 MED ORDER — KETOROLAC TROMETHAMINE 15 MG/ML IJ SOLN
15.0000 mg | INTRAMUSCULAR | Status: AC
  Administered 2018-08-03: 15 mg via INTRAVENOUS

## 2018-08-03 MED ORDER — ACETAMINOPHEN 500 MG PO TABS
ORAL_TABLET | ORAL | Status: AC
  Filled 2018-08-03: qty 2

## 2018-08-03 SURGICAL SUPPLY — 54 items
APPLIER CLIP 9.375 MED OPEN (MISCELLANEOUS)
BINDER BREAST LRG (GAUZE/BANDAGES/DRESSINGS) IMPLANT
BINDER BREAST MEDIUM (GAUZE/BANDAGES/DRESSINGS) ×3 IMPLANT
BINDER BREAST XLRG (GAUZE/BANDAGES/DRESSINGS) IMPLANT
BINDER BREAST XXLRG (GAUZE/BANDAGES/DRESSINGS) IMPLANT
BLADE SURG 15 STRL LF DISP TIS (BLADE) ×1 IMPLANT
BLADE SURG 15 STRL SS (BLADE) ×2
CANISTER SUCT 1200ML W/VALVE (MISCELLANEOUS) IMPLANT
CHLORAPREP W/TINT 26ML (MISCELLANEOUS) ×3 IMPLANT
CLIP APPLIE 9.375 MED OPEN (MISCELLANEOUS) IMPLANT
CLIP VESOCCLUDE SM WIDE 6/CT (CLIP) ×3 IMPLANT
CLOSURE WOUND 1/2 X4 (GAUZE/BANDAGES/DRESSINGS) ×1
COVER BACK TABLE 60X90IN (DRAPES) ×3 IMPLANT
COVER MAYO STAND STRL (DRAPES) ×3 IMPLANT
DECANTER SPIKE VIAL GLASS SM (MISCELLANEOUS) IMPLANT
DERMABOND ADVANCED (GAUZE/BANDAGES/DRESSINGS) ×2
DERMABOND ADVANCED .7 DNX12 (GAUZE/BANDAGES/DRESSINGS) ×1 IMPLANT
DEVICE DUBIN W/COMP PLATE 8390 (MISCELLANEOUS) IMPLANT
DRAPE LAPAROSCOPIC ABDOMINAL (DRAPES) ×3 IMPLANT
DRAPE UTILITY XL STRL (DRAPES) ×3 IMPLANT
DRSG TEGADERM 4X4.75 (GAUZE/BANDAGES/DRESSINGS) IMPLANT
ELECT COATED BLADE 2.86 ST (ELECTRODE) ×3 IMPLANT
ELECT REM PT RETURN 9FT ADLT (ELECTROSURGICAL) ×3
ELECTRODE REM PT RTRN 9FT ADLT (ELECTROSURGICAL) ×1 IMPLANT
GAUZE SPONGE 4X4 12PLY STRL LF (GAUZE/BANDAGES/DRESSINGS) ×3 IMPLANT
GLOVE BIO SURGEON STRL SZ7 (GLOVE) ×3 IMPLANT
GLOVE BIOGEL PI IND STRL 7.5 (GLOVE) ×1 IMPLANT
GLOVE BIOGEL PI INDICATOR 7.5 (GLOVE) ×2
GOWN STRL REUS W/ TWL LRG LVL3 (GOWN DISPOSABLE) ×3 IMPLANT
GOWN STRL REUS W/TWL LRG LVL3 (GOWN DISPOSABLE) ×6
HEMOSTAT ARISTA ABSORB 3G PWDR (MISCELLANEOUS) IMPLANT
ILLUMINATOR WAVEGUIDE N/F (MISCELLANEOUS) ×3 IMPLANT
KIT MARKER MARGIN INK (KITS) ×3 IMPLANT
LIGHT WAVEGUIDE WIDE FLAT (MISCELLANEOUS) IMPLANT
NEEDLE HYPO 25X1 1.5 SAFETY (NEEDLE) ×3 IMPLANT
NS IRRIG 1000ML POUR BTL (IV SOLUTION) ×3 IMPLANT
PACK BASIN DAY SURGERY FS (CUSTOM PROCEDURE TRAY) ×3 IMPLANT
PENCIL BUTTON HOLSTER BLD 10FT (ELECTRODE) ×3 IMPLANT
SLEEVE SCD COMPRESS KNEE MED (MISCELLANEOUS) ×3 IMPLANT
SPONGE LAP 4X18 RFD (DISPOSABLE) ×3 IMPLANT
STRIP CLOSURE SKIN 1/2X4 (GAUZE/BANDAGES/DRESSINGS) ×2 IMPLANT
SUT MNCRL AB 4-0 PS2 18 (SUTURE) IMPLANT
SUT MON AB 5-0 PS2 18 (SUTURE) ×3 IMPLANT
SUT SILK 2 0 SH (SUTURE) ×3 IMPLANT
SUT VIC AB 2-0 SH 27 (SUTURE) ×2
SUT VIC AB 2-0 SH 27XBRD (SUTURE) ×1 IMPLANT
SUT VIC AB 3-0 SH 27 (SUTURE) ×2
SUT VIC AB 3-0 SH 27X BRD (SUTURE) ×1 IMPLANT
SYR CONTROL 10ML LL (SYRINGE) ×3 IMPLANT
TOWEL GREEN STERILE FF (TOWEL DISPOSABLE) ×3 IMPLANT
TOWEL OR NON WOVEN STRL DISP B (DISPOSABLE) ×3 IMPLANT
TUBE CONNECTING 20'X1/4 (TUBING)
TUBE CONNECTING 20X1/4 (TUBING) IMPLANT
YANKAUER SUCT BULB TIP NO VENT (SUCTIONS) IMPLANT

## 2018-08-03 NOTE — H&P (Signed)
46 yof here for opinion on left breast mass. she has noted lateral left breast mass for over a year that has increased in size. no dc. no prior history. no real fh of breast or ovarian cancer. she vapes otherwise healthy. works as Theme park manager. no pain. she had us/mm 3/18 that shows c density breasts. right negative, left breast with probable benign fa measuring 2.2x1x1.7 cm. this increased in size to her and she went for repeat mm in 8/19. the mass is larger. it is now 3.5x1.8x2.9 cm. axilla negative. core biopsy shows fibroepithelial lesion. she is here to discuss options   Past Surgical History Geni Bers Haggett; 07/24/2018 1:59 PM) Foot Surgery  Bilateral.  Diagnostic Studies History Geni Bers Haggett; 07/24/2018 1:59 PM) Colonoscopy  never Mammogram  within last year Pap Smear  1-5 years ago  Allergies Geni Bers Haggett; 07/24/2018 1:59 PM) No Known Drug Allergies [07/24/2018]: Allergies Reconciled   Medication History (Jacqueline Haggett; 07/24/2018 2:00 PM) Levothyroxine Sodium (125MCG Tablet, Oral) Active. Omeprazole (40MG  Capsule DR, Oral) Active. Fluconazole (150MG  Tablet, Oral) Active. Multi Vitamin (Oral) Active. Medications Reconciled  Social History Geni Bers Haggett; 07/24/2018 1:59 PM) Alcohol use  Occasional alcohol use. Caffeine use  Carbonated beverages, Coffee. No drug use  Tobacco use  Current every day smoker.  Family History Geni Bers Haggett; 07/24/2018 1:59 PM) Arthritis  Mother.  Pregnancy / Birth History Geni Bers Haggett; 07/24/2018 1:59 PM) Age at menarche  12 years. Gravida  4 Maternal age  10-25 Para  3 Regular periods   Other Problems Geni Bers Haggett; 07/24/2018 1:59 PM) Thyroid Disease     Review of Systems Geni Bers Haggett; 07/24/2018 1:59 PM) General Not Present- Appetite Loss, Chills, Fatigue, Fever, Night Sweats, Weight Gain and Weight Loss. Skin Not Present- Change in Wart/Mole, Dryness,  Hives, Jaundice, New Lesions, Non-Healing Wounds, Rash and Ulcer. HEENT Not Present- Earache, Hearing Loss, Hoarseness, Nose Bleed, Oral Ulcers, Ringing in the Ears, Seasonal Allergies, Sinus Pain, Sore Throat, Visual Disturbances, Wears glasses/contact lenses and Yellow Eyes. Respiratory Not Present- Bloody sputum, Chronic Cough, Difficulty Breathing, Snoring and Wheezing. Breast Present- Breast Mass. Not Present- Breast Pain, Nipple Discharge and Skin Changes. Cardiovascular Not Present- Chest Pain, Difficulty Breathing Lying Down, Leg Cramps, Palpitations, Rapid Heart Rate, Shortness of Breath and Swelling of Extremities. Gastrointestinal Not Present- Abdominal Pain, Bloating, Bloody Stool, Change in Bowel Habits, Chronic diarrhea, Constipation, Difficulty Swallowing, Excessive gas, Gets full quickly at meals, Hemorrhoids, Indigestion, Nausea, Rectal Pain and Vomiting. Female Genitourinary Not Present- Frequency, Nocturia, Painful Urination, Pelvic Pain and Urgency. Musculoskeletal Not Present- Back Pain, Joint Pain, Joint Stiffness, Muscle Pain, Muscle Weakness and Swelling of Extremities. Neurological Not Present- Decreased Memory, Fainting, Headaches, Numbness, Seizures, Tingling, Tremor, Trouble walking and Weakness. Psychiatric Not Present- Anxiety, Bipolar, Change in Sleep Pattern, Depression, Fearful and Frequent crying. Endocrine Not Present- Cold Intolerance, Excessive Hunger, Hair Changes, Heat Intolerance, Hot flashes and New Diabetes. Hematology Not Present- Blood Thinners, Easy Bruising, Excessive bleeding, Gland problems, HIV and Persistent Infections.  Vitals Geni Bers Haggett; 07/24/2018 2:00 PM) 07/24/2018 2:00 PM Weight: 144 lb Height: 64in Body Surface Area: 1.7 m Body Mass Index: 24.72 kg/m  Pain Level: 0/10 Temp.: 98.16F(Temporal)  Pulse: 73 (Regular)  P.OX: 99% (Room air) BP: 102/68 (Sitting, Left Arm, Standard)       Physical Exam Rolm Bookbinder MD; 07/24/2018 2:40 PM) General Mental Status-Alert.  Head and Neck Trachea-midline. Thyroid Gland Characteristics - normal size and consistency.  Eye Sclera/Conjunctiva - Bilateral-No scleral icterus.  Breast Nipples-No Discharge. Note: left lateral mobile  2.5 cm breast mass nontender    Cardiovascular Note: rrr   Neurologic Neurologic evaluation reveals -alert and oriented x 3 with no impairment of recent or remote memory.  Lymphatic Head & Neck  General Head & Neck Lymphatics: Bilateral - Description - Normal. Axillary  General Axillary Region: Bilateral - Description - Normal. Note: no [place] adenopathy     Assessment & Plan Rolm Bookbinder MD; 07/24/2018 2:44 PM) LEFT BREAST MASS (N63.20) Story: Left breast mass excisional biopsy clinically this appears to be fa. 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 I think excisional biopsy without attempt to take margins is best first approach at this point. can do through periareolar incision to hide scar and I dont think this will impact breast appearance. If simple fa then would be done. If this is phyllodes on final path (I provided her path today) then she understands she would likely need further surgery. risks and recovery discussed

## 2018-08-03 NOTE — Discharge Instructions (Signed)
[facility] °Office Phone Number [phone] ° °POST OP INSTRUCTIONS °Take 400 mg of ibuprofen every 8 hours or 650 mg tylenol every 6 hours for next 72 hours then as needed. Use ice several times daily also. °Always review your discharge instruction sheet given to you by the facility where your surgery was performed. ° °IF YOU HAVE DISABILITY OR FAMILY LEAVE FORMS, YOU MUST BRING THEM TO THE OFFICE FOR PROCESSING.  DO NOT GIVE THEM TO YOUR DOCTOR. ° °1. A prescription for pain medication may be given to you upon discharge.  Take your pain medication as prescribed, if needed.  If narcotic pain medicine is not needed, then you may take acetaminophen (Tylenol), naprosyn (Alleve) or ibuprofen (Advil) as needed. °2. Take your usually prescribed medications unless otherwise directed °3. If you need a refill on your pain medication, please contact your pharmacy.  They will contact our office to request authorization.  Prescriptions will not be filled after 5pm or on week-ends. °4. You should eat very light the first 24 hours after surgery, such as soup, crackers, pudding, etc.  Resume your normal diet the day after surgery. °5. Most patients will experience some swelling and bruising in the breast.  Ice packs and a good support bra will help.  Wear the breast binder provided or a sports bra for 72 hours day and night.  After that wear a sports bra during the day until you return to the office. Swelling and bruising can take several days to resolve.  °6. It is common to experience some constipation if taking pain medication after surgery.  Increasing fluid intake and taking a stool softener will usually help or prevent this problem from occurring.  A mild laxative (Milk of Magnesia or Miralax) should be taken according to package directions if there are no bowel movements after 48 hours. °7. Unless discharge instructions indicate otherwise, you may remove your bandages 48 hours after surgery and you may  shower at that time.  You may have steri-strips (small skin tapes) in place directly over the incision.  These strips should be left on the skin for 7-10 days and will come off on their own.  If your surgeon used skin glue on the incision, you may shower in 24 hours.  The glue will flake off over the next 2-3 weeks.  Any sutures or staples will be removed at the office during your follow-up visit. °8. ACTIVITIES:  You may resume regular daily activities (gradually increasing) beginning the next day.  Wearing a good support bra or sports bra minimizes pain and swelling.  You may have sexual intercourse when it is comfortable. °a. You may drive when you no longer are taking prescription pain medication, you can comfortably wear a seatbelt, and you can safely maneuver your car and apply brakes. °b. RETURN TO WORK:  ______________________________________________________________________________________ °9. You should see your doctor in the office for a follow-up appointment approximately two weeks after your surgery.  Your doctor’s nurse will typically make your follow-up appointment when she calls you with your pathology report.  Expect your pathology report 3-4 business days after your surgery.  You may call to check if you do not hear from us after three days. °10. OTHER INSTRUCTIONS: _______________________________________________________________________________________________ _____________________________________________________________________________________________________________________________________ °_____________________________________________________________________________________________________________________________________ °_____________________________________________________________________________________________________________________________________ ° °WHEN TO CALL DR WAKEFIELD: °1. Fever over 101.0 °2. Nausea and/or vomiting. °3. Extreme swelling or bruising. °4. Continued bleeding from  incision. °5. Increased pain, redness, or drainage from the incision. ° °The clinic staff is available to   answer your questions during regular business hours.  Please don’t hesitate to call and ask to speak to one of the nurses for clinical concerns.  If you have a medical emergency, go to the nearest emergency room or call 911.  A surgeon from [facility] is always on call at the hospital. ° °For further questions, please visit [email or web address] ° ° ° ° °Post Anesthesia Home Care Instructions ° °Activity: °Get plenty of rest for the remainder of the day. A responsible individual must stay with you for 24 hours following the procedure.  °For the next 24 hours, DO NOT: °-Drive a car °-Operate machinery °-Drink alcoholic beverages °-Take any medication unless instructed by your physician °-Make any legal decisions or sign important papers. ° °Meals: °Start with liquid foods such as gelatin or soup. Progress to regular foods as tolerated. Avoid greasy, spicy, heavy foods. If nausea and/or vomiting occur, drink only clear liquids until the nausea and/or vomiting subsides. Call your physician if vomiting continues. ° °Special Instructions/Symptoms: °Your throat may feel dry or sore from the anesthesia or the breathing tube placed in your throat during surgery. If this causes discomfort, gargle with warm salt water. The discomfort should disappear within 24 hours. ° °If you had a scopolamine patch placed behind your ear for the management of post- operative nausea and/or vomiting: ° °1. The medication in the patch is effective for 72 hours, after which it should be removed.  Wrap patch in a tissue and discard in the trash. Wash hands thoroughly with soap and water. °2. You may remove the patch earlier than 72 hours if you experience unpleasant side effects which may include dry mouth, dizziness or visual disturbances. °3. Avoid touching the patch. Wash your hands with soap and water after contact  with the patch. °   ° °

## 2018-08-03 NOTE — Anesthesia Preprocedure Evaluation (Signed)
Anesthesia Evaluation  Patient identified by MRN, date of birth, ID band Patient awake    Reviewed: Allergy & Precautions, NPO status , Patient's Chart, lab work & pertinent test results  Airway Mallampati: II  TM Distance: >3 FB Neck ROM: Full    Dental no notable dental hx.    Pulmonary neg pulmonary ROS, Current Smoker,    Pulmonary exam normal breath sounds clear to auscultation       Cardiovascular negative cardio ROS Normal cardiovascular exam Rhythm:Regular Rate:Normal     Neuro/Psych negative neurological ROS  negative psych ROS   GI/Hepatic Neg liver ROS, GERD  ,  Endo/Other  negative endocrine ROSHyperthyroidism   Renal/GU negative Renal ROS     Musculoskeletal negative musculoskeletal ROS (+)   Abdominal   Peds  Hematology negative hematology ROS (+)   Anesthesia Other Findings   Reproductive/Obstetrics negative OB ROS                             Anesthesia Physical Anesthesia Plan  ASA: II  Anesthesia Plan: General   Post-op Pain Management:    Induction: Intravenous  PONV Risk Score and Plan: 3 and Ondansetron, Dexamethasone, Midazolam and Scopolamine patch - Pre-op  Airway Management Planned: LMA  Additional Equipment:   Intra-op Plan:   Post-operative Plan: Extubation in OR  Informed Consent: I have reviewed the patients History and Physical, chart, labs and discussed the procedure including the risks, benefits and alternatives for the proposed anesthesia with the patient or authorized representative who has indicated his/her understanding and acceptance.   Dental advisory given  Plan Discussed with: CRNA  Anesthesia Plan Comments:         Anesthesia Quick Evaluation

## 2018-08-03 NOTE — Anesthesia Procedure Notes (Signed)
Procedure Name: LMA Insertion Performed by: Vieno Tarrant W, CRNA Pre-anesthesia Checklist: Patient identified, Emergency Drugs available, Suction available and Patient being monitored Patient Re-evaluated:Patient Re-evaluated prior to induction Oxygen Delivery Method: Circle system utilized Preoxygenation: Pre-oxygenation with 100% oxygen Induction Type: IV induction Ventilation: Mask ventilation without difficulty LMA: LMA inserted LMA Size: 4.0 Number of attempts: 1 Placement Confirmation: positive ETCO2 Tube secured with: Tape Dental Injury: Teeth and Oropharynx as per pre-operative assessment        

## 2018-08-03 NOTE — Op Note (Signed)
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 Serita Grammes Anesthesia: General Complications: None Estimated blood loss: Minimal Specimens: Left breast mass marked with paint Sponge needle count was correct x2 at end of operation Disposition to recovery in stable condition  Indications: This is a 42 year old female who has an enlarging left breast mass.  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  She underwent a core biopsy that shows a fibroepithelial lesion.  We discussed excision without any additional margins at this time.  She understands that she may need additional surgery.  Procedure: After informed consent was obtained the patient was taken to the operating room.  She was given antibiotics.  SCDs were in place.  She was then placed under general anesthesia without complication.  She was prepped and draped in the standard sterile surgical fashion.  I identified the mass in the lower outer quadrant.  I infiltrated Marcaine around the areola and in the area of the mass.  I then made a periareolar incision in order to hide the scar.  I used the lighted retractor system to dissect down to the mass.  Once I entered the area where the mass was it very easily freed from the surrounding tissues.  It did not appear infiltrative.  I remove the mass without any additional tissue at this time.  The mass was then marked with paint and passed off the table for pathology.  I placed a single clip to mark the site.  This was done just in case I need to go remove more margins.  I then obtained hemostasis.  I closed the breast tissue with 2-0 Vicryl.  The dermis was closed with 3-0 Vicryl and the skin with 5-0 Monocryl.  Glue and Steri-Strips were applied.  She tolerated this well was extubated and transferred to recovery stable.

## 2018-08-03 NOTE — Anesthesia Postprocedure Evaluation (Signed)
Anesthesia Post Note  Patient: Carolyn Nelson  Procedure(s) Performed: EXCISION LEFT BREAST MASS (Left Breast)     Patient location during evaluation: PACU Anesthesia Type: General Level of consciousness: awake and alert, awake and oriented Pain management: pain level controlled Vital Signs Assessment: post-procedure vital signs reviewed and stable Respiratory status: spontaneous breathing, nonlabored ventilation and respiratory function stable Cardiovascular status: blood pressure returned to baseline and stable Postop Assessment: no apparent nausea or vomiting Anesthetic complications: no    Last Vitals:  Vitals:   08/03/18 1200 08/03/18 1234  BP: 95/69 98/70  Pulse: 64 70  Resp: 14 16  Temp:  36.4 C  SpO2: 100% 100%    Last Pain:  Vitals:   08/03/18 1234  TempSrc: Oral  PainSc: 2                  Catalina Gravel

## 2018-08-03 NOTE — Interval H&P Note (Signed)
History and Physical Interval Note:  08/03/2018 10:20 AM  Carolyn Nelson  has presented today for surgery, with the diagnosis of LEFT BREAST MASS  The various methods of treatment have been discussed with the patient and family. After consideration of risks, benefits and other options for treatment, the patient has consented to  Procedure(s): EXCISION LEFT BREAST MASS (Left) as a surgical intervention .  The patient's history has been reviewed, patient examined, no change in status, stable for surgery.  I have reviewed the patient's chart and labs.  Questions were answered to the patient's satisfaction.     Rolm Bookbinder

## 2018-08-03 NOTE — Transfer of Care (Signed)
Immediate Anesthesia Transfer of Care Note  Patient: Carolyn Nelson  Procedure(s) Performed: EXCISION LEFT BREAST MASS (Left Breast)  Patient Location: PACU  Anesthesia Type:General  Level of Consciousness: awake and sedated  Airway & Oxygen Therapy: Patient Spontanous Breathing and Patient connected to face mask oxygen  Post-op Assessment: Report given to RN and Post -op Vital signs reviewed and stable  Post vital signs: Reviewed and stable  Last Vitals:  Vitals Value Taken Time  BP    Temp    Pulse 57 08/03/2018 11:36 AM  Resp 10 08/03/2018 11:36 AM  SpO2 100 % 08/03/2018 11:36 AM  Vitals shown include unvalidated device data.  Last Pain:  Vitals:   08/03/18 0955  TempSrc: Oral  PainSc: 0-No pain      Patients Stated Pain Goal: 5 (21/19/41 7408)  Complications: No apparent anesthesia complications

## 2018-08-04 ENCOUNTER — Encounter (HOSPITAL_BASED_OUTPATIENT_CLINIC_OR_DEPARTMENT_OTHER): Payer: Self-pay | Admitting: General Surgery

## 2018-10-19 DIAGNOSIS — H66001 Acute suppurative otitis media without spontaneous rupture of ear drum, right ear: Secondary | ICD-10-CM | POA: Diagnosis not present

## 2018-10-19 DIAGNOSIS — B379 Candidiasis, unspecified: Secondary | ICD-10-CM | POA: Diagnosis not present

## 2018-10-19 DIAGNOSIS — J029 Acute pharyngitis, unspecified: Secondary | ICD-10-CM | POA: Diagnosis not present

## 2018-11-08 DIAGNOSIS — J069 Acute upper respiratory infection, unspecified: Secondary | ICD-10-CM | POA: Diagnosis not present

## 2018-11-08 DIAGNOSIS — J209 Acute bronchitis, unspecified: Secondary | ICD-10-CM | POA: Diagnosis not present

## 2019-01-03 DIAGNOSIS — E890 Postprocedural hypothyroidism: Secondary | ICD-10-CM | POA: Diagnosis not present

## 2019-01-03 DIAGNOSIS — E559 Vitamin D deficiency, unspecified: Secondary | ICD-10-CM | POA: Diagnosis not present

## 2019-01-03 DIAGNOSIS — R7301 Impaired fasting glucose: Secondary | ICD-10-CM | POA: Diagnosis not present

## 2019-01-05 DIAGNOSIS — R7301 Impaired fasting glucose: Secondary | ICD-10-CM | POA: Diagnosis not present

## 2019-01-05 DIAGNOSIS — E559 Vitamin D deficiency, unspecified: Secondary | ICD-10-CM | POA: Diagnosis not present

## 2019-01-05 DIAGNOSIS — E890 Postprocedural hypothyroidism: Secondary | ICD-10-CM | POA: Diagnosis not present

## 2019-02-23 ENCOUNTER — Telehealth: Payer: Self-pay

## 2019-02-23 NOTE — Telephone Encounter (Signed)
Pt called; thinks she's having a miscarriage; has questions.  [phone]  Pt started bleeding Sunday, extremely heavy, filling pad and tampon in 65min.  Has taken two preg tests, both faintly positive; is cramping.  Adv per policy to go to ED.  Pt requests appt Mon since bleeding has lightened some.  Tx to [facility] for scheduling.  Adv in meantime if get heavy again to go to ED.

## 2019-02-26 ENCOUNTER — Encounter: Payer: Self-pay | Admitting: Obstetrics and Gynecology

## 2019-02-26 ENCOUNTER — Other Ambulatory Visit: Payer: Self-pay

## 2019-02-26 ENCOUNTER — Ambulatory Visit (INDEPENDENT_AMBULATORY_CARE_PROVIDER_SITE_OTHER): Payer: 59 | Admitting: Obstetrics and Gynecology

## 2019-02-26 VITALS — BP 110/68 | HR 76 | Wt 136.0 lb

## 2019-02-26 DIAGNOSIS — O0281 Inappropriate change in quantitative human chorionic gonadotropin (hCG) in early pregnancy: Secondary | ICD-10-CM

## 2019-02-26 DIAGNOSIS — Z3202 Encounter for pregnancy test, result negative: Secondary | ICD-10-CM | POA: Diagnosis not present

## 2019-02-26 DIAGNOSIS — N920 Excessive and frequent menstruation with regular cycle: Secondary | ICD-10-CM

## 2019-02-26 LAB — POCT URINE PREGNANCY: Preg Test, Ur: NEGATIVE

## 2019-02-26 NOTE — Progress Notes (Signed)
Obstetric Problem Visit    Chief Complaint:  Chief Complaint  Patient presents with  . Menorrhagia    LMP 4/12, faint positive pregnancy test 4/17    History of Present Illness: Patient is a 43 y.o. [id number] Unknown presenting for first trimester bleeding.  The onset of bleeding was 02/18/2019 but lasted for about 6 days and was much heavier than her usual menstrual flow and contained some clots.  Current means of contraception is condoms with inconsistent use  Is bleeding equal to or greater than normal menstrual flow:  Yes Any recent trauma:  No Prior ultrasound demonstrating IUP: none.  Prior ultrasound demonstrating viable IUP:  No Prior Serum HCG:  No   Review of Systems: Review of Systems  Constitutional: Negative.   Gastrointestinal: Negative for abdominal pain, nausea and vomiting.  Genitourinary: Negative.     Past Medical History:  Past Medical History:  Diagnosis Date  . BRCA negative 11/2017   MyRisk neg  . Family history of breast cancer 11/2017   IBIS=13.9%/riskscore=9.3%  . GERD (gastroesophageal reflux disease)   . Hyperthyroidism   . Left breast mass 2018   8/19 fiborepithelial lesion with atypia  . Vitamin D deficiency     Past Surgical History:  Past Surgical History:  Procedure Laterality Date  . BREAST BIOPSY Left 08/03/2018   Procedure: EXCISION LEFT BREAST MASS;  Surgeon: Rolm Bookbinder, MD;  Location: [facility];  Service: General;  Laterality: Left;  . BUNIONECTOMY    . CESAREAN SECTION      Obstetric History: [id number]  Family History:  Family History  Problem Relation Age of Onset  . Lung cancer Maternal Grandmother   . Lung cancer Maternal Grandfather   . Breast cancer Paternal Grandmother 54  . Lung cancer Paternal Grandmother   . Diabetes Paternal Grandfather     Social History:  Social History   Socioeconomic History  . Marital status: Married    Spouse name: Not on file  . Number of children: Not on  file  . Years of education: Not on file  . Highest education level: Not on file  Occupational History  . Not on file  Social Needs  . Financial resource strain: Not on file  . Food insecurity:    Worry: Not on file    Inability: Not on file  . Transportation needs:    Medical: Not on file    Non-medical: Not on file  Tobacco Use  . Smoking status: Light Tobacco Smoker    Packs/day: 0.25  . Smokeless tobacco: Never Used  . Tobacco comment: quit 07/2017  Substance and Sexual Activity  . Alcohol use: Yes    Alcohol/week: 1.0 standard drinks    Types: 1 Glasses of wine per week  . Drug use: No  . Sexual activity: Yes    Birth control/protection: None  Lifestyle  . Physical activity:    Days per week: Not on file    Minutes per session: Not on file  . Stress: Not on file  Relationships  . Social connections:    Talks on phone: Not on file    Gets together: Not on file    Attends religious service: Not on file    Active member of club or organization: Not on file    Attends meetings of clubs or organizations: Not on file    Relationship status: Not on file  . Intimate partner violence:    Fear of current or ex partner: Not on  file    Emotionally abused: Not on file    Physically abused: Not on file    Forced sexual activity: Not on file  Other Topics Concern  . Not on file  Social History Narrative  . Not on file    Allergies:  No Known Allergies  Medications: Prior to Admission medications   Medication Sig Start Date End Date Taking? Authorizing Provider  levothyroxine (SYNTHROID) 112 MCG tablet Take 112 mcg by mouth daily before breakfast.   Yes [provider]  Multiple Vitamin (MULTIVITAMIN WITH MINERALS) TABS tablet Take 1 tablet by mouth daily. Women's Multivitamin   Yes [provider]  omeprazole (PRILOSEC) 40 MG capsule Take 40 mg by mouth daily as needed (for acid reflux.).   Yes [provider]  traMADol (ULTRAM) 50 MG tablet  Take 1 tablet (50 mg total) by mouth every 6 (six) hours as needed. Patient not taking: Reported on 02/26/2019 08/03/18   Rolm Bookbinder, MD    Physical Exam Vitals: Blood pressure 110/68, pulse 76, weight 136 lb (61.7 kg), last menstrual period 02/18/2019. General: NAD, well nourished, appears stated age 63: normocephalic, anicteric Genitourinary:  External: Normal external female genitalia.  Normal urethral meatus, normal Bartholin's and Skene's glands.    Vagina: Normal vaginal mucosa, no evidence of prolapse.    Cervix: closed no active bleeding  Uterus: non-enlarged, regular contour  Adnexa: ovaries non-enlarged, no adnexal masses  Rectal: deferred Extremities: no edema, erythema, or tenderness Neurologic: Grossly intact Psychiatric: mood appropriate, affect full  Assessment: 43 y.o. [id number] Unknown presenting for evaluation of first trimester vaginal bleeding  Plan: Problem List Items Addressed This Visit    None    Visit Diagnoses    Menorrhagia with regular cycle    -  Primary   Relevant Orders   POCT urine pregnancy (Completed)      1) First trimester bleeding - incidence and clinical course of first trimester bleeding is discussed in detail with the patient today.  Approximately 1/3 of pregnancies ending in live births experienced 1st trimester bleeding.  The amount of bleeding is variable and not necessarily predictive of outcome.  Sources may be cervical or uterine.  Subchorionic hemorrhages are a frequent concurrent findings on ultrasound and are followed expectantly.  These often absorb or regress spontaneously although risk for expansion and further disruption of the utero-placental interface leading to miscarriage is possible.  There is no clearly documented benefit to limiting or modifying activity and sexual intercourse in altering clinic course of 1st trimester bleeding.    2) Given negative UPT today likely represents chemical pregnancy.  3) Routine  bleeding precautions were discussed with the patient prior the conclusion of today's visit.  4) Return in about 9 months (around 11/28/2019) for annual.    Malachy Mood, MD, [place] 02/26/2019, 10:56 AM

## 2019-04-06 ENCOUNTER — Telehealth: Payer: Self-pay

## 2019-04-06 NOTE — Telephone Encounter (Signed)
Pt aware.

## 2019-04-06 NOTE — Telephone Encounter (Signed)
Let her know to keep an eye on bleeding over weekend, and if still heavy or weird, let us know Monday and we can arrange check up and ultrasound, even labs.

## 2019-04-06 NOTE — Telephone Encounter (Signed)
Pt states she was seen about a month ago for a possible miscarriage. She is still passing strange things during her first cycle. [phone]

## 2019-04-06 NOTE — Telephone Encounter (Signed)
Spoke w/pt. She states she was 16 days late this month which she realizes is normal. She isn't bleeding heavy, but is concerned as she passed a 2 in" long gray substance w/clots.

## 2020-02-18 ENCOUNTER — Ambulatory Visit: Payer: 59

## 2020-04-10 ENCOUNTER — Other Ambulatory Visit: Payer: Self-pay | Admitting: Obstetrics

## 2020-04-10 ENCOUNTER — Other Ambulatory Visit: Payer: Self-pay

## 2020-04-10 ENCOUNTER — Encounter: Payer: Self-pay | Admitting: Obstetrics

## 2020-04-10 ENCOUNTER — Ambulatory Visit (INDEPENDENT_AMBULATORY_CARE_PROVIDER_SITE_OTHER): Payer: 59 | Admitting: Obstetrics

## 2020-04-10 ENCOUNTER — Other Ambulatory Visit (HOSPITAL_COMMUNITY)
Admission: RE | Admit: 2020-04-10 | Discharge: 2020-04-10 | Disposition: A | Discharge status: Home / Self Care | Payer: 59 | Source: Ambulatory Visit | Attending: Obstetrics | Admitting: Obstetrics

## 2020-04-10 VITALS — BP 100/72 | Ht 64.0 in | Wt 139.0 lb

## 2020-04-10 DIAGNOSIS — L293 Anogenital pruritus, unspecified: Secondary | ICD-10-CM | POA: Diagnosis not present

## 2020-04-10 DIAGNOSIS — R300 Dysuria: Secondary | ICD-10-CM | POA: Diagnosis not present

## 2020-04-10 DIAGNOSIS — R399 Unspecified symptoms and signs involving the genitourinary system: Secondary | ICD-10-CM

## 2020-04-10 DIAGNOSIS — Z01419 Encounter for gynecological examination (general) (routine) without abnormal findings: Secondary | ICD-10-CM | POA: Insufficient documentation

## 2020-04-10 LAB — POCT URINALYSIS DIPSTICK
Bilirubin, UA: NEGATIVE
Blood, UA: NEGATIVE
Glucose, UA: NEGATIVE
Ketones, UA: NEGATIVE
Leukocytes, UA: NEGATIVE
Nitrite, UA: NEGATIVE
Protein, UA: NEGATIVE
Spec Grav, UA: 1.02 (ref 1.010–1.025)
Urobilinogen, UA: 0.2 E.U./dL
pH, UA: 5 (ref 5.0–8.0)

## 2020-04-10 MED ORDER — NYSTATIN-TRIAMCINOLONE 100000-0.1 UNIT/GM-% EX CREA: 1.0000 "application " | TOPICAL_CREAM | Freq: Two times a day (BID) | CUTANEOUS | 1 refills | Status: DC

## 2020-04-10 NOTE — Progress Notes (Signed)
Gynecology Annual Exam  PCP: Chad Cordial, PA-C  Chief Complaint:  Chief Complaint  Patient presents with  . Gynecologic Exam     POSS UTI    History of Present Illness: Patient is a 44 y.o. [id number] presents for annual exam. The patient is also c/o some external irritation.  Has recently been treated for a UTI, and subsequent yeast infection. She was given Diflucan for the yeast, and has still c/o of slight burning sensation externally, not associated with urination. She denies any vaginal discharge or odor.   LMP: Patient's last menstrual period was 03/19/2020. Average Interval: regular, 28 days Duration of flow: 5 days Heavy Menses: no Clots: no Intermenstrual Bleeding: no Postcoital Bleeding: no Dysmenorrhea: no   The patient is sexually active. She currently uses vasectomy for contraception. She denies dyspareunia.  The patient does perform self breast exams.  There is no notable family history of breast or ovarian cancer in her family.  The patient wears seatbelts: yes.   The patient has regular exercise: yes.    The patient denies current symptoms of depression.   She is married, and has three children. 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  Review of Systems: ROS  Past Medical History:  Patient Active Problem List   Diagnosis Date Noted  . Dysuria 04/10/2020  . Encounter for annual routine gynecological examination 04/10/2020  . 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     Past Surgical History:  Past Surgical History:  Procedure Laterality Date  . BREAST BIOPSY Left 08/03/2018   Procedure: EXCISION LEFT BREAST MASS;  Surgeon: Rolm Bookbinder, MD;  Location: [facility];  Service: General;  Laterality: Left;  . BUNIONECTOMY    . CESAREAN SECTION      Gynecologic History:  Patient's last menstrual period was 03/19/2020. Contraception: vasectomy Last Pap: Results were: normal per her report no abnormalities  Last  mammogram: performed and was remarkable for a fibroadenoma that was removed surgically in 2019. She has not had a mammogram repeated since then.  Obstetric History: [id number]  Family History:  Family History  Problem Relation Age of Onset  . Lung cancer Maternal Grandmother   . Lung cancer Maternal Grandfather   . Breast cancer Paternal Grandmother 82  . Lung cancer Paternal Grandmother   . Diabetes Paternal Grandfather     Social History:  Social History   Socioeconomic History  . Marital status: Married    Spouse name: Not on file  . Number of children: Not on file  . Years of education: Not on file  . Highest education level: Not on file  Occupational History  . Not on file  Tobacco Use  . Smoking status: Light Tobacco Smoker    Packs/day: 0.25  . Smokeless tobacco: Never Used  . Tobacco comment: quit 07/2017  Substance and Sexual Activity  . Alcohol use: Yes    Alcohol/week: 1.0 standard drinks    Types: 1 Glasses of wine per week  . Drug use: No  . Sexual activity: Yes    Birth control/protection: None  Other Topics Concern  . Not on file  Social History Narrative  . Not on file   Social Determinants of Health   Financial Resource Strain:   . Difficulty of Paying Living Expenses:   Food Insecurity:   . Worried About Charity fundraiser in the Last Year:   . [place] in the Last Year:  Transportation Needs:   . Film/video editor (Medical):   Marland Kitchen Lack of Transportation (Non-Medical):   Physical Activity:   . Days of Exercise per Week:   . Minutes of Exercise per Session:   Stress:   . Feeling of Stress :   Social Connections:   . Frequency of Communication with Friends and Family:   . Frequency of Social Gatherings with Friends and Family:   . Attends Religious Services:   . Active Member of Clubs or Organizations:   . Attends Archivist Meetings:   Marland Kitchen Marital Status:   Intimate Partner Violence:   . Fear of Current or Ex-Partner:     . Emotionally Abused:   Marland Kitchen Physically Abused:   . Sexually Abused:     Allergies:  No Known Allergies  Medications: Prior to Admission medications   Medication Sig Start Date End Date Taking? Authorizing Provider  levothyroxine (SYNTHROID) 112 MCG tablet Take 112 mcg by mouth daily before breakfast.   Yes [provider]  Multiple Vitamin (MULTIVITAMIN WITH MINERALS) TABS tablet Take 1 tablet by mouth daily. Women's Multivitamin   Yes [provider]  omeprazole (PRILOSEC) 40 MG capsule Take 40 mg by mouth daily as needed (for acid reflux.).   Yes [provider]  omeprazole (PRILOSEC) 40 MG capsule Take by mouth. 08/27/14  Yes [provider]    Physical Exam Vitals: Blood pressure 100/72, height 5\' 4"  (1.626 m), weight 139 lb (63 kg), last menstrual period 03/19/2020.  General: NAD HEENT: normocephalic, anicteric Thyroid: no enlargement, no palpable nodules Pulmonary: No increased work of breathing, CTAB Cardiovascular: RRR, distal pulses 2+ Breast: Breast symmetrical, no tenderness, no palpable nodules or masses, no skin or nipple retraction present, no nipple discharge.  No axillary or supraclavicular lymphadenopathy. Abdomen: NABS, soft, non-tender, non-distended.  Umbilicus without lesions.  No hepatomegaly, splenomegaly or masses palpable. No evidence of hernia  Genitourinary:  External: Normal external female genitalia.  Normal urethral meatus, normal Bartholin's and Skene's glands.  Shaves entire mons.  Vagina: Normal vaginal mucosa, no evidence of prolapse.    Cervix: Grossly normal in appearance, no bleeding  Uterus: Non-enlarged, mobile, normal contour. Anteverted. No CMT  Adnexa: ovaries non-enlarged, no adnexal masses  Rectal: deferred  Lymphatic: no evidence of inguinal lymphadenopathy Extremities: no edema, erythema, or tenderness Neurologic: Grossly intact Psychiatric: mood appropriate, affect full  Female chaperone present  for pelvic and breast  portions of the physical exam    Assessment: 44 y.o. [id number] routine annual exam  Plan: Problem List Items Addressed This Visit      Other   Dysuria   Encounter for annual routine gynecological examination    Other Visit Diagnoses    UTI symptoms    -  Primary   Relevant Orders   POCT Urinalysis Dipstick (Completed)      1) Mammogram - recommend yearly screening mammogram.  Mammogram discussed with her today. Per her report, she can start with annual ammmos at afge 50. Offered  order for mammogram today is declined   2) STI screening  wasoffered and declined  3) ASCCP guidelines and rational discussed.  Patient opts for every 5 years screening interval  4) Contraception - the patient is currently using  vasectomy.  She is happy with her current form of contraception and plans to continue  5) Colonoscopy -- Screening recommended starting at age 78 for average risk individuals, age 75 for individuals deemed at increased risk (including African Americans) and recommended to  continue until age 27.  For patient age 51-85 individualized approach is recommended.  Gold standard screening is via colonoscopy, Cologuard screening is an acceptable alternative for patient unwilling or unable to undergo colonoscopy.  "Colorectal cancer screening for average?risk adults: 2018 guideline update from the American Cancer Society"CA: A Cancer Journal for Clinicians: Apr 06, 2017   6) Routine healthcare maintenance including cholesterol, diabetes screening discussed managed by PCP  7) Return in about 1 year (around 04/10/2021) for for annual GYN PE.  8) She has recently been treated for a UTI, and resulting yeast after compleing her antibiotics. She continues to have some slight perineal irritation. Monistat has not been helpful. Mycolog cream is prescribed for her today, with instructions to use this sparingly externally.   Imagene Riches, CNM  04/10/2020 12:27 PM  [facility] 04/10/2020, 12:20 PM

## 2020-04-10 NOTE — Addendum Note (Signed)
Addended by: Imagene Riches on: 04/10/2020 12:40 PM   Modules accepted: Orders

## 2020-04-15 LAB — CYTOLOGY - PAP
Comment: NEGATIVE
Diagnosis: UNDETERMINED — AB
High risk HPV: NEGATIVE

## 2020-04-18 ENCOUNTER — Encounter: Payer: Self-pay | Admitting: Obstetrics

## 2020-04-18 NOTE — Progress Notes (Signed)
Letter sent to the patient explaining her ASCUS pap with negative HPV. She will return for a repeat pap in 6 months as follow up.  Imagene Riches, CNM  04/18/2020 9:00 AM

## 2020-04-21 ENCOUNTER — Encounter: Payer: Self-pay | Admitting: Obstetrics and Gynecology

## 2020-04-21 ENCOUNTER — Other Ambulatory Visit: Payer: Self-pay

## 2020-04-21 ENCOUNTER — Ambulatory Visit (INDEPENDENT_AMBULATORY_CARE_PROVIDER_SITE_OTHER): Payer: 59 | Admitting: Obstetrics and Gynecology

## 2020-04-21 VITALS — BP 112/70 | Ht 64.0 in | Wt 140.0 lb

## 2020-04-21 DIAGNOSIS — N939 Abnormal uterine and vaginal bleeding, unspecified: Secondary | ICD-10-CM

## 2020-04-21 DIAGNOSIS — N920 Excessive and frequent menstruation with regular cycle: Secondary | ICD-10-CM | POA: Diagnosis not present

## 2020-04-21 NOTE — Patient Instructions (Signed)
Endometrial Ablation  Endometrial ablation is a procedure that destroys the thin inner layer of the lining of the uterus (endometrium). This procedure may be done:  To stop heavy periods.  To stop bleeding that is causing anemia.  To control irregular bleeding.  To treat bleeding caused by small tumors (fibroids) in the endometrium.  This procedure is often an alternative to major surgery, such as removal of the uterus and cervix (hysterectomy). As a result of this procedure:  You may not be able to have children. However, if you are premenopausal (you have not gone through menopause):  You may still have a small chance of getting pregnant.  You will need to use a reliable method of birth control after the procedure to prevent pregnancy.  You may stop having a menstrual period, or you may have only a small amount of bleeding during your period. Menstruation may return several years after the procedure.  Tell a health care provider about:  Any allergies you have.  All medicines you are taking, including vitamins, herbs, eye drops, creams, and over-the-counter medicines.  Any problems you or family members have had with the use of anesthetic medicines.  Any blood disorders you have.  Any surgeries you have had.  Any medical conditions you have.  What are the risks?  Generally, this is a safe procedure. However, problems may occur, including:  A hole (perforation) in the uterus or bowel.  Infection of the uterus, bladder, or vagina.  Bleeding.  Damage to other structures or organs.  An air bubble in the lung (air embolus).  Problems with pregnancy after the procedure.  Failure of the procedure.  Decreased ability to diagnose cancer in the endometrium.  What happens before the procedure?  You will have tests of your endometrium to make sure there are no pre-cancerous cells or cancer cells present.  You may have an ultrasound of the uterus.  You may be given medicines to thin the endometrium.  Ask your health care  provider about:  Changing or stopping your regular medicines. This is especially important if you take diabetes medicines or blood thinners.  Taking medicines such as aspirin and ibuprofen. These medicines can thin your blood. Do not take these medicines before your procedure if your doctor tells you not to.  Plan to have someone take you home from the hospital or clinic.  What happens during the procedure?    You will lie on an exam table with your feet and legs supported as in a pelvic exam.  To lower your risk of infection:  Your health care team will wash or sanitize their hands and put on germ-free (sterile) gloves.  Your genital area will be washed with soap.  An IV tube will be inserted into one of your veins.  You will be given a medicine to help you relax (sedative).  A surgical instrument with a light and camera (resectoscope) will be inserted into your vagina and moved into your uterus. This allows your surgeon to see inside your uterus.  Endometrial tissue will be removed using one of the following methods:  Radiofrequency. This method uses a radiofrequency-alternating electric current to remove the endometrium.  Cryotherapy. This method uses extreme cold to freeze the endometrium.  Heated-free liquid. This method uses a heated saltwater (saline) solution to remove the endometrium.  Microwave. This method uses high-energy microwaves to heat up the endometrium and remove it.  Thermal balloon. This method involves inserting a catheter with a balloon tip into   the uterus. The balloon tip is filled with heated fluid to remove the endometrium.  The procedure may vary among health care providers and hospitals.  What happens after the procedure?  Your blood pressure, heart rate, breathing rate, and blood oxygen level will be monitored until the medicines you were given have worn off.  As tissue healing occurs, you may notice vaginal bleeding for 4-6 weeks after the procedure. You may also  experience:  Cramps.  Thin, watery vaginal discharge that is light pink or brown in color.  A need to urinate more frequently than usual.  Nausea.  Do not drive for 24 hours if you were given a sedative.  Do not have sex or insert anything into your vagina until your health care provider approves.  Summary  Endometrial ablation is done to treat the many causes of heavy menstrual bleeding.  The procedure may be done only after medications have been tried to control the bleeding.  Plan to have someone take you home from the hospital or clinic.  This information is not intended to replace advice given to you by your health care provider. Make sure you discuss any questions you have with your health care provider.  Document Revised: 04/11/2018 Document Reviewed: 11/11/2016  Elsevier Patient Education  2020 [employer]

## 2020-04-21 NOTE — Progress Notes (Signed)
Patient ID: Carolyn Nelson, female   DOB: 1976/10/18, 44 y.o.   MRN: [id number]  Reason for Consult: Gynecologic Exam   Referred by Copland, Deirdre Evener, PA-C  Subjective:     HPI:  Carolyn Nelson is a 44 y.o. female she presents today for consultation regarding menorrhagia.  She reports for the last 2 or 3 years she has been having increased heaviness of her menstrual cycle.  She reports that her menstrual cycle is regular and monthly.  She reports that her menstrual cycle usually last for 5 to 6 days.  She reports that for at least 1 to 2 days during her menstrual cycle she has extremely heavy bleeding.  She reports that she has gushing and flooding sensations frequently.  She reports that she has to wear a pad and a tampon at the same time.  She reports that she has to change her pad and tampon frequently or else she will have accidents.  She reports that at nighttime she has accidents which are difficult to manage.  She denies any history of anemia.  She denies any history of passing large clots.  She denies any spotting or bleeding in between her periods or after intercourse.  She is sexually active and she uses a vasectomy for contraception.  She had an IUD in the past and had complications related to severe infection and it was thought that she may have a Lyme disease or illness.  The symptoms resolved once her IUD is was removed and she is hesitant to have another IUD replaced.  She does not desire hormonal contraception for improvement of management of her menorrhagia because she would not like to be on hormone therapy.    Past Medical History:  Diagnosis Date  . BRCA negative 11/2017   MyRisk neg  . Family history of breast cancer 11/2017   IBIS=13.9%/riskscore=9.3%  . GERD (gastroesophageal reflux disease)   . Hyperthyroidism   . Left breast mass 2018   8/19 fiborepithelial lesion with atypia  . Vitamin D deficiency    Family History  Problem Relation Age of Onset  . Lung cancer  Maternal Grandmother   . Lung cancer Maternal Grandfather   . Breast cancer Paternal Grandmother 32  . Lung cancer Paternal Grandmother   . Diabetes Paternal Grandfather    Past Surgical History:  Procedure Laterality Date  . BREAST BIOPSY Left 08/03/2018   Procedure: EXCISION LEFT BREAST MASS;  Surgeon: Rolm Bookbinder, MD;  Location: [place];  Service: General;  Laterality: Left;  . BUNIONECTOMY    . CESAREAN SECTION      Short Social History:  Social History   Tobacco Use  . Smoking status: Light Tobacco Smoker    Packs/day: 0.25  . Smokeless tobacco: Never Used  . Tobacco comment: quit 07/2017  Substance Use Topics  . Alcohol use: Yes    Alcohol/week: 1.0 standard drink    Types: 1 Glasses of wine per week    No Known Allergies  Current Outpatient Medications  Medication Sig Dispense Refill  . levothyroxine (SYNTHROID) 112 MCG tablet Take 112 mcg by mouth daily before breakfast.    . Multiple Vitamin (MULTIVITAMIN WITH MINERALS) TABS tablet Take 1 tablet by mouth daily. Women's Multivitamin    . nystatin-triamcinolone (MYCOLOG II) cream Apply 1 application topically 2 (two) times daily. 30 g 1  . omeprazole (PRILOSEC) 40 MG capsule Take 40 mg by mouth daily as needed (for acid reflux.).    Marland Kitchen  omeprazole (PRILOSEC) 40 MG capsule Take by mouth.     No current facility-administered medications for this visit.    Review of Systems  Constitutional: Negative for chills, fatigue, fever and unexpected weight change.  HENT: Negative for trouble swallowing.  Eyes: Negative for loss of vision.  Respiratory: Negative for cough, shortness of breath and wheezing.  Cardiovascular: Negative for chest pain, leg swelling, palpitations and syncope.  GI: Negative for abdominal pain, blood in stool, diarrhea, nausea and vomiting.  GU: Negative for difficulty urinating, dysuria, frequency and hematuria.  Musculoskeletal: Negative for back pain, leg pain and joint  pain.  Skin: Negative for rash.  Neurological: Negative for dizziness, headaches, light-headedness, numbness and seizures.  Psychiatric: Negative for behavioral problem, confusion, depressed mood and sleep disturbance.        Objective:  Objective   Vitals:   04/21/20 0948  BP: 112/70  Weight: 140 lb (63.5 kg)  Height: _0  (1.626 m)   Body mass index is 24.03 kg/m.  Physical Exam Vitals and nursing note reviewed.  Constitutional:      Appearance: She is well-developed.  HENT:     Head: Normocephalic and atraumatic.  Eyes:     Pupils: Pupils are equal, round, and reactive to light.  Cardiovascular:     Rate and Rhythm: Normal rate and regular rhythm.  Pulmonary:     Effort: Pulmonary effort is normal. No respiratory distress.  Skin:    General: Skin is warm and dry.  Neurological:     Mental Status: She is alert and oriented to person, place, and time.  Psychiatric:        Behavior: Behavior normal.        Thought Content: Thought content normal.        Judgment: Judgment normal.        Assessment/Plan:     44 year old with menorrhagia with regular menstrual cycle. She desires uterine ablation for definitive management of her menorrhagia.  She is counseled on other options such as hormone therapy and IUD but does not desire these for the above-stated reasons.  Patient was told that it is normal to have menstrual bleeding after an endometrial ablation, only about 80% of patients become amenorrheic, 10% of patients have normal or light periods, and 10% of patients have no change in their bleeding pattern and may need further intervention.  She was told she will observe her periods for a few months after her ablation to see what her periods will be like; it is recommended to wait until at least three months after the procedure before making conclusions about how periods are going to be like after an ablation.  She will return for a pelvic ultrasound and preoperative  consultation.  Note sent to schedule procedure.  More than 25 minutes were spent face to face with the patient in the room, reviewing the medical record, labs and images, and coordinating care for the patient. The plan of management was discussed in detail and counseling was provided.     Adrian Prows MD [facility], [facility] 04/21/2020 10:00 AM

## 2020-04-28 ENCOUNTER — Encounter: Payer: Self-pay | Admitting: Obstetrics and Gynecology

## 2020-04-28 ENCOUNTER — Telehealth: Payer: Self-pay | Admitting: Obstetrics and Gynecology

## 2020-04-28 ENCOUNTER — Ambulatory Visit (INDEPENDENT_AMBULATORY_CARE_PROVIDER_SITE_OTHER): Payer: 59 | Admitting: Obstetrics and Gynecology

## 2020-04-28 ENCOUNTER — Ambulatory Visit (INDEPENDENT_AMBULATORY_CARE_PROVIDER_SITE_OTHER): Payer: 59

## 2020-04-28 ENCOUNTER — Other Ambulatory Visit: Payer: Self-pay

## 2020-04-28 VITALS — BP 115/74 | HR 80 | Resp 16 | Ht 64.0 in | Wt 140.4 lb

## 2020-04-28 DIAGNOSIS — N939 Abnormal uterine and vaginal bleeding, unspecified: Secondary | ICD-10-CM

## 2020-04-28 DIAGNOSIS — N920 Excessive and frequent menstruation with regular cycle: Secondary | ICD-10-CM

## 2020-04-28 NOTE — Telephone Encounter (Signed)
Called pt to schedule hysteroscopy with [facility]  DOS 7/22  H&P on day of surgery - pt coming in today for appt but too soon to obtain consents  Covid testing 7/20 @ 8-10:30, [address], drive up and wear mask. Advised pt to quarantine until DOS.  Pre-admit phone call appointment to be requested - date and time will be included on H&P paper work. Also all appointments will be updated on pt MyChart. Explained that this appointment has a call window. Based on the time scheduled will indicate if the call will be received within a 4 hour window before 1:00 or after.  Advised that pt may also receive calls from the hospital pharmacy and pre-service center.  Confirmed pt has [employer] as Chartered certified accountant. No secondary insurance.

## 2020-04-28 NOTE — Telephone Encounter (Signed)
-----   Message from Homero Fellers, MD sent at 04/21/2020 10:04 AM EDT ----- Surgery Booking Request Patient Full Name:  Carolyn Nelson  MRN: [id number]  DOB: 1976/09/07  Surgeon: Homero Fellers, MD  Requested Surgery Date and Time: at patient convenience Primary Diagnosis AND Code: menorrhagia with regular cycle- n92.0 Secondary Diagnosis and Code:  Surgical Procedure: hysteroscopy, D&C, endometrial ablation with novasure L&D Notification: No Admission Status: same day surgery Length of Surgery: 1 hour Special Case Needs: No H&P: Yes Phone Interview???:  Yes Interpreter: No Language:  Medical Clearance:  No Special Scheduling Instructions: none Any known health/anesthesia issues, diabetes, sleep apnea, latex allergy, defibrillator/pacemaker?: No Acuity: P4   (P1 highest, P2 delay may cause harm, P3 low, elective gyn, P4 lowest)

## 2020-04-28 NOTE — Progress Notes (Signed)
   Patient ID: Carolyn Nelson, female   DOB: December 04, 1975, 44 y.o.   MRN: [id number]  Reason for Consult: Gynecologic Exam   Referred by Copland, Deirdre Evener, PA-C  Subjective:     HPI:  Carolyn Nelson is a 44 y.o. female. She has a history of menorrhagia and is considering and endometrial ablation. She is done with having children.   Gynecological History  Patient's last menstrual period was 04/13/2020.   History of fibroids, polyps, or ovarian cysts? : no  History of PCOS? no Hstory of Endometriosis? no History of abnormal pap smears? no Have you had any sexually transmitted infections in the past? no   Past Medical History:  Diagnosis Date  . BRCA negative 11/2017   MyRisk neg  . Family history of breast cancer 11/2017   IBIS=13.9%/riskscore=9.3%  . GERD (gastroesophageal reflux disease)   . Hyperthyroidism   . Left breast mass 2018   8/19 fiborepithelial lesion with atypia  . Vitamin D deficiency    Family History  Problem Relation Age of Onset  . Lung cancer Maternal Grandmother   . Lung cancer Maternal Grandfather   . Breast cancer Paternal Grandmother 7  . Lung cancer Paternal Grandmother   . Diabetes Paternal Grandfather    Past Surgical History:  Procedure Laterality Date  . BREAST BIOPSY Left 08/03/2018   Procedure: EXCISION LEFT BREAST MASS;  Surgeon: Rolm Bookbinder, MD;  Location: [place];  Service: General;  Laterality: Left;  . BUNIONECTOMY    . CESAREAN SECTION    . DILITATION & CURRETTAGE/HYSTROSCOPY WITH NOVASURE ABLATION N/A 05/29/2020   Procedure: DILATATION & CURETTAGE/HYSTEROSCOPY WITH NOVASURE ABLATION;  Surgeon: Homero Fellers, MD;  Location: [facility];  Service: Gynecology;  Laterality: N/A;  105, 5.5 and 50.30 novasure defict 15    Short Social History:  Social History   Tobacco Use  . Smoking status: Light Tobacco Smoker    Packs/day: 0.25  . Smokeless tobacco: Never Used  . Tobacco comment: quit 07/2017  Substance  Use Topics  . Alcohol use: Yes    Alcohol/week: 1.0 standard drink    Types: 1 Glasses of wine per week    No Known Allergies  Current Outpatient Medications  Medication Sig Dispense Refill  . levothyroxine (SYNTHROID) 100 MCG tablet Take 100 mcg by mouth daily before breakfast.     . omeprazole (PRILOSEC) 40 MG capsule Take 40 mg by mouth daily.     Marland Kitchen acetaminophen (TYLENOL) 500 MG tablet Take 2 tablets (1,000 mg total) by mouth every 6 (six) hours as needed. 100 tablet 2  . cholecalciferol (VITAMIN D3) 25 MCG (1000 UNIT) tablet Take 1,000 Units by mouth daily.    Marland Kitchen ibuprofen (ADVIL) 600 MG tablet Take 1 tablet (600 mg total) by mouth every 6 (six) hours as needed. 60 tablet 3   No current facility-administered medications for this visit.    REVIEW OF SYSTEMS      Objective:  Objective   Vitals:   04/28/20 1625  BP: 115/74  Pulse: 80  Resp: 16  SpO2: 99%  Weight: 140 lb 6.4 oz (63.7 kg)  Height: '5\' 4"'$  (1.626 m)   Body mass index is 24.1 kg/m.  Physical Exam  Assessment/Plan:     44 yo with menorrhagia We discussed endometrial ablation. She would like to move forward with scheduleing. Not sent to scheduler.    Adrian Prows MD [facility] 03/04/2021 7:53 PM

## 2020-05-16 ENCOUNTER — Inpatient Hospital Stay: Admission: RE | Admit: 2020-05-16 | Payer: 59 | Source: Ambulatory Visit

## 2020-05-26 ENCOUNTER — Encounter
Admission: RE | Admit: 2020-05-26 | Discharge: 2020-05-26 | Disposition: A | Discharge status: Home / Self Care | Payer: 59 | Source: Ambulatory Visit | Attending: Obstetrics and Gynecology | Admitting: Obstetrics and Gynecology

## 2020-05-26 ENCOUNTER — Other Ambulatory Visit: Payer: Self-pay

## 2020-05-26 NOTE — Patient Instructions (Signed)
Your procedure is scheduled on: 05/29/20 Report to [facility]. To find out your arrival time please call [phone] between 1PM - 3PM on 05/28/20.  Remember: Instructions that are not followed completely may result in serious medical risk, up to and including death, or upon the discretion of your surgeon and anesthesiologist your surgery may need to be rescheduled.     _X__ 1. Do not eat food after midnight the night before your procedure.                 No gum chewing or hard candies. You may drink clear liquids up to 2 hours                 before you are scheduled to arrive for your surgery- DO not drink clear                 liquids within 2 hours of the start of your surgery.                 Clear Liquids include:  water, apple juice without pulp, clear carbohydrate                 drink such as Clearfast or Gatorade, Black Coffee or Tea (Do not add                 anything to coffee or tea). Diabetics water only  __X__2.  On the morning of surgery brush your teeth with toothpaste and water, you                 may rinse your mouth with mouthwash if you wish.  Do not swallow any              toothpaste of mouthwash.     _X__ 3.  No Alcohol for 24 hours before or after surgery.   _X__ 4.  Do Not Smoke or use e-cigarettes For 24 Hours Prior to Your Surgery.                 Do not use any chewable tobacco products for at least 6 hours prior to                 surgery.  ____  5.  Bring all medications with you on the day of surgery if instructed.   __X__  6.  Notify your doctor if there is any change in your medical condition      (cold, fever, infections).     Do not wear jewelry, make-up, hairpins, clips or nail polish. Do not wear lotions, powders, or perfumes.  Do not shave 48 hours prior to surgery. Men may shave face and neck. Do not bring valuables to the hospital.    [facility] is not responsible for any belongings or  valuables.  Contacts, dentures/partials or body piercings may not be worn into surgery. Bring a case for your contacts, glasses or hearing aids, a denture cup will be supplied. Leave your suitcase in the car. After surgery it may be brought to your room. For patients admitted to the hospital, discharge time is determined by your treatment team.   Patients discharged the day of surgery will not be allowed to drive home.   Please read over the following fact sheets that you were given:   MRSA Information  __X__ Take these medicines the morning of surgery with A SIP OF WATER:  1. omeprazole (PRILOSEC) 40 MG capsule  2. levothyroxine (SYNTHROID) 100 MCG tablet  3.   4.  5.  6.  ____ Fleet Enema (as directed)   ____ Use CHG Soap/SAGE wipes as directed  ____ Use inhalers on the day of surgery  ____ Stop metformin/Janumet/Farxiga 2 days prior to surgery    ____ Take 1/2 of usual insulin dose the night before surgery. No insulin the morning          of surgery.   ____ Stop Blood Thinners Coumadin/Plavix/Xarelto/Pleta/Pradaxa/Eliquis/Effient/Aspirin  on   Or contact your Surgeon, Cardiologist or Medical Doctor regarding  ability to stop your blood thinners  __X__ Stop Anti-inflammatories 7 days before surgery such as Advil, Ibuprofen, Motrin,  BC or Goodies Powder, Naprosyn, Naproxen, Aleve, Aspirin    __X__ Stop all herbal supplements, fish oil or vitamin E until after surgery.    ____ Bring C-Pap to the hospital.     ENSURE PRE SURGERY DRINK SHOULD BE COMPLETED 2 HOURS BEFORE ARRIVING FOR YOUR PROCEDURE

## 2020-05-27 ENCOUNTER — Other Ambulatory Visit
Admission: RE | Admit: 2020-05-27 | Discharge: 2020-05-27 | Disposition: A | Discharge status: Home / Self Care | Payer: 59 | Source: Ambulatory Visit | Attending: Obstetrics and Gynecology | Admitting: Obstetrics and Gynecology

## 2020-05-27 DIAGNOSIS — Z20822 Contact with and (suspected) exposure to covid-19: Secondary | ICD-10-CM | POA: Diagnosis not present

## 2020-05-27 DIAGNOSIS — Z01812 Encounter for preprocedural laboratory examination: Secondary | ICD-10-CM | POA: Insufficient documentation

## 2020-05-27 LAB — CBC
HCT: 29.8 % — ABNORMAL LOW (ref 36.0–46.0)
Hemoglobin: 9 g/dL — ABNORMAL LOW (ref 12.0–15.0)
MCH: 21.3 pg — ABNORMAL LOW (ref 26.0–34.0)
MCHC: 30.2 g/dL (ref 30.0–36.0)
MCV: 70.4 fL — ABNORMAL LOW (ref 80.0–100.0)
Platelets: 192 10*3/uL (ref 150–400)
RBC: 4.23 MIL/uL (ref 3.87–5.11)
RDW: 17.5 % — ABNORMAL HIGH (ref 11.5–15.5)
WBC: 2.9 10*3/uL — ABNORMAL LOW (ref 4.0–10.5)
nRBC: 0 % (ref 0.0–0.2)

## 2020-05-27 LAB — TYPE AND SCREEN
ABO/RH(D): A POS
Antibody Screen: NEGATIVE

## 2020-05-27 LAB — SARS CORONAVIRUS 2 (TAT 6-24 HRS): SARS Coronavirus 2: NEGATIVE

## 2020-05-27 NOTE — Progress Notes (Signed)
  [facility] Perioperative Services: Pre-Admission/Anesthesia Testing  Abnormal Lab Notification    Date: 05/27/20  Name: HILDAGARD SOBECKI MRN:   [id number]  Re: Abnormal labs noted during PAT appointment   Provider Notified: Homero Fellers, MD Notification mode: Routed via [facility]   ABNORMAL LAB VALUE(S): Lab Results  Component Value Date   WBC 2.9 (L) 05/27/2020   HGB 9.0 (L) 05/27/2020   HCT 29.8 (L) 05/27/2020   MCV 70.4 (L) 05/27/2020   MCH 21.3 (L) 05/27/2020   RDW 17.5 (H) 05/27/2020   PLT 192 05/27/2020   NOTES: Patient with menorrhagia diagnosis. T&S performed during PAT visit. Patient pending D&C on 05/29/2020  [place] Loh, MSN, APRN, FNP-C, CEN [facility]  Peri-operative Services Nurse Practitioner Phone: [phone] 05/27/20 11:00 AM

## 2020-05-29 ENCOUNTER — Encounter: Payer: Self-pay | Admitting: Obstetrics and Gynecology

## 2020-05-29 ENCOUNTER — Encounter
Admission: RE | Disposition: A | Discharge status: Home / Self Care | Payer: Self-pay | Source: Home / Self Care | Attending: Obstetrics and Gynecology

## 2020-05-29 ENCOUNTER — Ambulatory Visit
Admission: RE | Admit: 2020-05-29 | Discharge: 2020-05-29 | Disposition: A | Discharge status: Home / Self Care | Payer: 59 | Attending: Obstetrics and Gynecology | Admitting: Obstetrics and Gynecology

## 2020-05-29 ENCOUNTER — Other Ambulatory Visit: Payer: Self-pay

## 2020-05-29 ENCOUNTER — Ambulatory Visit: Payer: 59 | Admitting: Anesthesiology

## 2020-05-29 DIAGNOSIS — Z801 Family history of malignant neoplasm of trachea, bronchus and lung: Secondary | ICD-10-CM | POA: Insufficient documentation

## 2020-05-29 DIAGNOSIS — Z79899 Other long term (current) drug therapy: Secondary | ICD-10-CM | POA: Insufficient documentation

## 2020-05-29 DIAGNOSIS — E0590 Thyrotoxicosis, unspecified without thyrotoxic crisis or storm: Secondary | ICD-10-CM | POA: Diagnosis not present

## 2020-05-29 DIAGNOSIS — Z803 Family history of malignant neoplasm of breast: Secondary | ICD-10-CM | POA: Insufficient documentation

## 2020-05-29 DIAGNOSIS — F17200 Nicotine dependence, unspecified, uncomplicated: Secondary | ICD-10-CM | POA: Diagnosis not present

## 2020-05-29 DIAGNOSIS — N840 Polyp of corpus uteri: Secondary | ICD-10-CM | POA: Insufficient documentation

## 2020-05-29 DIAGNOSIS — N920 Excessive and frequent menstruation with regular cycle: Secondary | ICD-10-CM | POA: Diagnosis present

## 2020-05-29 DIAGNOSIS — K219 Gastro-esophageal reflux disease without esophagitis: Secondary | ICD-10-CM | POA: Diagnosis not present

## 2020-05-29 DIAGNOSIS — E559 Vitamin D deficiency, unspecified: Secondary | ICD-10-CM | POA: Diagnosis not present

## 2020-05-29 DIAGNOSIS — Z833 Family history of diabetes mellitus: Secondary | ICD-10-CM | POA: Diagnosis not present

## 2020-05-29 HISTORY — PX: DILITATION & CURRETTAGE/HYSTROSCOPY WITH NOVASURE ABLATION: SHX5568

## 2020-05-29 LAB — POCT PREGNANCY, URINE: Preg Test, Ur: NEGATIVE

## 2020-05-29 LAB — ABO/RH: ABO/RH(D): A POS

## 2020-05-29 SURGERY — DILATATION & CURETTAGE/HYSTEROSCOPY WITH NOVASURE ABLATION
Anesthesia: General

## 2020-05-29 MED ORDER — FENTANYL CITRATE (PF) 100 MCG/2ML IJ SOLN
INTRAMUSCULAR | Status: AC
  Filled 2020-05-29: qty 2

## 2020-05-29 MED ORDER — ACETAMINOPHEN 500 MG PO TABS
1000.0000 mg | ORAL_TABLET | Freq: Four times a day (QID) | ORAL | 2 refills | Status: AC | PRN
Start: 2020-05-29 — End: 2021-05-29

## 2020-05-29 MED ORDER — PROPOFOL 10 MG/ML IV BOLUS
INTRAVENOUS | Status: DC | PRN
  Administered 2020-05-29: 75 mg via INTRAVENOUS
  Administered 2020-05-29: 125 mg via INTRAVENOUS

## 2020-05-29 MED ORDER — CHLORHEXIDINE GLUCONATE 0.12 % MT SOLN: 15.0000 mL | Freq: Once | OROMUCOSAL | Status: AC

## 2020-05-29 MED ORDER — LACTATED RINGERS IV SOLN: INTRAVENOUS | Status: DC

## 2020-05-29 MED ORDER — IBUPROFEN 600 MG PO TABS: 600.0000 mg | ORAL_TABLET | Freq: Four times a day (QID) | ORAL | 3 refills | Status: DC | PRN

## 2020-05-29 MED ORDER — ONDANSETRON HCL 4 MG/2ML IJ SOLN
INTRAMUSCULAR | Status: DC | PRN
  Administered 2020-05-29: 4 mg via INTRAVENOUS

## 2020-05-29 MED ORDER — PHENYLEPHRINE HCL (PRESSORS) 10 MG/ML IV SOLN
INTRAVENOUS | Status: AC
  Filled 2020-05-29: qty 1

## 2020-05-29 MED ORDER — OXYCODONE HCL 5 MG PO TABS
ORAL_TABLET | ORAL | Status: AC
  Filled 2020-05-29: qty 1

## 2020-05-29 MED ORDER — LIDOCAINE HCL (PF) 2 % IJ SOLN
INTRAMUSCULAR | Status: AC
  Filled 2020-05-29: qty 5

## 2020-05-29 MED ORDER — TRAMADOL HCL 50 MG PO TABS: 50.0000 mg | ORAL_TABLET | Freq: Four times a day (QID) | ORAL | 1 refills | Status: AC | PRN

## 2020-05-29 MED ORDER — CHLORHEXIDINE GLUCONATE 0.12 % MT SOLN
OROMUCOSAL | Status: AC
  Administered 2020-05-29: 15 mL via OROMUCOSAL
  Filled 2020-05-29: qty 15

## 2020-05-29 MED ORDER — OXYCODONE HCL 5 MG PO TABS
5.0000 mg | ORAL_TABLET | Freq: Once | ORAL | Status: AC | PRN
  Administered 2020-05-29: 5 mg via ORAL

## 2020-05-29 MED ORDER — MIDAZOLAM HCL 2 MG/2ML IJ SOLN
INTRAMUSCULAR | Status: DC | PRN
  Administered 2020-05-29: 2 mg via INTRAVENOUS

## 2020-05-29 MED ORDER — FENTANYL CITRATE (PF) 100 MCG/2ML IJ SOLN: 25.0000 ug | INTRAMUSCULAR | Status: DC | PRN

## 2020-05-29 MED ORDER — SILVER NITRATE-POT NITRATE 75-25 % EX MISC
CUTANEOUS | Status: DC | PRN
  Administered 2020-05-29: 1

## 2020-05-29 MED ORDER — ONDANSETRON HCL 4 MG/2ML IJ SOLN
INTRAMUSCULAR | Status: AC
  Filled 2020-05-29: qty 2

## 2020-05-29 MED ORDER — ORAL CARE MOUTH RINSE: 15.0000 mL | Freq: Once | OROMUCOSAL | Status: AC

## 2020-05-29 MED ORDER — PROMETHAZINE HCL 25 MG/ML IJ SOLN: 6.2500 mg | INTRAMUSCULAR | Status: DC | PRN

## 2020-05-29 MED ORDER — FENTANYL CITRATE (PF) 100 MCG/2ML IJ SOLN
INTRAMUSCULAR | Status: DC | PRN
  Administered 2020-05-29 (×4): 12.5 ug via INTRAVENOUS

## 2020-05-29 MED ORDER — GLYCOPYRROLATE 0.2 MG/ML IJ SOLN
INTRAMUSCULAR | Status: AC
  Filled 2020-05-29: qty 1

## 2020-05-29 MED ORDER — PROPOFOL 10 MG/ML IV BOLUS
INTRAVENOUS | Status: AC
  Filled 2020-05-29: qty 20

## 2020-05-29 MED ORDER — LIDOCAINE HCL (CARDIAC) PF 100 MG/5ML IV SOSY
PREFILLED_SYRINGE | INTRAVENOUS | Status: DC | PRN
  Administered 2020-05-29: 60 mg via INTRAVENOUS

## 2020-05-29 MED ORDER — MIDAZOLAM HCL 2 MG/2ML IJ SOLN
INTRAMUSCULAR | Status: AC
  Filled 2020-05-29: qty 2

## 2020-05-29 MED ORDER — OXYCODONE HCL 5 MG/5ML PO SOLN: 5.0000 mg | Freq: Once | ORAL | Status: AC | PRN

## 2020-05-29 MED ORDER — DEXAMETHASONE SODIUM PHOSPHATE 10 MG/ML IJ SOLN
INTRAMUSCULAR | Status: DC | PRN
  Administered 2020-05-29: 10 mg via INTRAVENOUS

## 2020-05-29 MED ORDER — DEXAMETHASONE SODIUM PHOSPHATE 10 MG/ML IJ SOLN
INTRAMUSCULAR | Status: AC
  Filled 2020-05-29: qty 1

## 2020-05-29 SURGICAL SUPPLY — 28 items
ABLATOR SURESOUND NOVASURE (ABLATOR) IMPLANT
BAG DRN RND TRDRP ANRFLXCHMBR (UROLOGICAL SUPPLIES) ×1
BAG URINE DRAIN 2000ML AR STRL (UROLOGICAL SUPPLIES) ×2 IMPLANT
CATH FOLEY 2WAY  5CC 16FR (CATHETERS) ×1
CATH FOLEY 2WAY 5CC 16FR (CATHETERS) ×1
CATH ROBINSON RED A/P 16FR (CATHETERS) ×2 IMPLANT
CATH URTH 16FR FL 2W BLN LF (CATHETERS) ×1 IMPLANT
COVER WAND RF STERILE (DRAPES) ×2 IMPLANT
GLOVE BIOGEL PI IND STRL 7.0 (GLOVE) ×1 IMPLANT
GLOVE BIOGEL PI INDICATOR 7.0 (GLOVE) ×1
GLOVE SURG SYN 7.0 (GLOVE) ×2 IMPLANT
GOWN STRL REUS W/ TWL LRG LVL3 (GOWN DISPOSABLE) ×2 IMPLANT
GOWN STRL REUS W/TWL LRG LVL3 (GOWN DISPOSABLE) ×4
HANDPIECE ABLA MINERVA ENDO (MISCELLANEOUS) IMPLANT
IV LACTATED RINGER IRRG 3000ML (IV SOLUTION)
IV LACTATED RINGERS 1000ML (IV SOLUTION) ×2 IMPLANT
IV LR IRRIG 3000ML ARTHROMATIC (IV SOLUTION) IMPLANT
IV NS 1000ML (IV SOLUTION) ×2
IV NS 1000ML BAXH (IV SOLUTION) ×1 IMPLANT
KIT PROCEDURE FLUENT (KITS) IMPLANT
KIT TURNOVER CYSTO (KITS) ×2 IMPLANT
NS IRRIG 500ML POUR BTL (IV SOLUTION) ×2 IMPLANT
PACK DNC HYST (MISCELLANEOUS) ×2 IMPLANT
PAD OB MATERNITY 4.3X12.25 (PERSONAL CARE ITEMS) ×2 IMPLANT
PAD PREP 24X41 OB/GYN DISP (PERSONAL CARE ITEMS) ×2 IMPLANT
SYR 10ML LL (SYRINGE) ×2 IMPLANT
TOWEL OR 17X26 4PK STRL BLUE (TOWEL DISPOSABLE) ×2 IMPLANT
TUBING CONNECTING 10 (TUBING) ×2 IMPLANT

## 2020-05-29 NOTE — Transfer of Care (Signed)
Immediate Anesthesia Transfer of Care Note  Patient: Carolyn Nelson  Procedure(s) Performed: DILATATION & CURETTAGE/HYSTEROSCOPY WITH NOVASURE ABLATION (N/A )  Patient Location: PACU  Anesthesia Type:General  Level of Consciousness: awake  Airway & Oxygen Therapy: Patient connected to face mask oxygen  Post-op Assessment: Post -op Vital signs reviewed and stable  Post vital signs: Reviewed and stable  Last Vitals:  Vitals Value Taken Time  BP 114/72 05/29/20 0846  Temp 36.2 C 05/29/20 0845  Pulse 73 05/29/20 0847  Resp 13 05/29/20 0847  SpO2 100 % 05/29/20 0847  Vitals shown include unvalidated device data.  Last Pain:  Vitals:   05/29/20 0633  TempSrc: Temporal  PainSc: 0-No pain         Complications: No complications documented.

## 2020-05-29 NOTE — Anesthesia Procedure Notes (Signed)
Procedure Name: LMA Insertion Date/Time: 05/29/2020 7:36 AM Performed by: Nolon Lennert, RN Pre-anesthesia Checklist: Patient identified, Patient being monitored, Timeout performed, Emergency Drugs available and Suction available Patient Re-evaluated:Patient Re-evaluated prior to induction Oxygen Delivery Method: Circle system utilized Preoxygenation: Pre-oxygenation with 100% oxygen Induction Type: IV induction Ventilation: Mask ventilation without difficulty LMA: LMA inserted LMA Size: 3.0 Tube type: Oral Number of attempts: 1 Placement Confirmation: positive ETCO2 and breath sounds checked- equal and bilateral Tube secured with: Tape Dental Injury: Teeth and Oropharynx as per pre-operative assessment

## 2020-05-29 NOTE — H&P (Signed)
Carolyn Nelson is an 44 y.o. female.   Chief Complaint: Menorrhagia  HPI: Carolyn Nelson is a 44 y.o. female she presents today for consultation regarding menorrhagia.  She reports for the last 2 or 3 years she has been having increased heaviness of her menstrual cycle.  She reports that her menstrual cycle is regular and monthly.  She reports that her menstrual cycle usually last for 5 to 6 days.  She reports that for at least 1 to 2 days during her menstrual cycle she has extremely heavy bleeding.  She reports that she has gushing and flooding sensations frequently.  She reports that she has to wear a pad and a tampon at the same time.  She reports that she has to change her pad and tampon frequently or else she will have accidents.  She reports that at nighttime she has accidents which are difficult to manage.  She denies any history of anemia.  She denies any history of passing large clots.  She denies any spotting or bleeding in between her periods or after intercourse.  She is sexually active and she uses a vasectomy for contraception.  She had an IUD in the past and had complications related to severe infection and it was thought that she may have a Lyme disease or illness.  The symptoms resolved once her IUD is was removed and she is hesitant to have another IUD replaced.  She does not desire hormonal contraception for improvement of management of her menorrhagia because she would not like to be on hormone therapy.  Past Medical History:  Diagnosis Date  . BRCA negative 11/2017   MyRisk neg  . Family history of breast cancer 11/2017   IBIS=13.9%/riskscore=9.3%  . GERD (gastroesophageal reflux disease)   . Hyperthyroidism   . Left breast mass 2018   8/19 fiborepithelial lesion with atypia  . Vitamin D deficiency     Past Surgical History:  Procedure Laterality Date  . BREAST BIOPSY Left 08/03/2018   Procedure: EXCISION LEFT BREAST MASS;  Surgeon: Rolm Bookbinder, MD;  Location: [place];  Service: General;  Laterality: Left;  . BUNIONECTOMY    . CESAREAN SECTION      Family History  Problem Relation Age of Onset  . Lung cancer Maternal Grandmother   . Lung cancer Maternal Grandfather   . Breast cancer Paternal Grandmother 68  . Lung cancer Paternal Grandmother   . Diabetes Paternal Grandfather    Social History:  reports that she has been smoking. She has been smoking about 0.25 packs per day. She has never used smokeless tobacco. She reports current alcohol use of about 1.0 standard drink of alcohol per week. She reports that she does not use drugs.  Allergies: No Known Allergies  Medications Prior to Admission  Medication Sig Dispense Refill  . cholecalciferol (VITAMIN D3) 25 MCG (1000 UNIT) tablet Take 1,000 Units by mouth daily.    Marland Kitchen levothyroxine (SYNTHROID) 100 MCG tablet Take 100 mcg by mouth daily before breakfast.     . omeprazole (PRILOSEC) 40 MG capsule Take 40 mg by mouth daily.       Results for orders placed or performed during the hospital encounter of 05/29/20 (from the past 48 hour(s))  Pregnancy, urine POC     Status: None   Collection Time: 05/29/20  6:23 AM  Result Value Ref Range   Preg Test, Ur NEGATIVE NEGATIVE    Comment:        THE  SENSITIVITY OF THIS METHODOLOGY IS >24 mIU/mL   ABO/Rh     Status: None   Collection Time: 05/29/20  6:54 AM  Result Value Ref Range   ABO/RH(D)      A POS Performed at [facility], [address] [place], [place] [address]    No results found.  Review of Systems  Constitutional: Negative for chills and fever.  HENT: Negative for congestion, hearing loss and sinus pain.   Respiratory: Negative for cough, shortness of breath and wheezing.   Cardiovascular: Negative for chest pain, palpitations and leg swelling.  Gastrointestinal: Negative for abdominal pain, constipation, diarrhea, nausea and vomiting.  Genitourinary: Negative for dysuria, flank pain, frequency, hematuria  and urgency.  Musculoskeletal: Negative for back pain.  Skin: Negative for rash.  Neurological: Negative for dizziness and headaches.  Psychiatric/Behavioral: Negative for suicidal ideas. The patient is not nervous/anxious.     Blood pressure (!) 97/97, pulse 63, temperature 98 F (36.7 C), temperature source Temporal, resp. rate 16, last menstrual period 05/15/2020, SpO2 100 %. Physical Exam Vitals and nursing note reviewed.  Constitutional:      Appearance: She is well-developed.  HENT:     Head: Normocephalic and atraumatic.  Cardiovascular:     Rate and Rhythm: Normal rate and regular rhythm.  Pulmonary:     Effort: Pulmonary effort is normal.     Breath sounds: Normal breath sounds.  Abdominal:     General: Bowel sounds are normal.     Palpations: Abdomen is soft.  Musculoskeletal:        General: Normal range of motion.  Skin:    General: Skin is warm and dry.  Neurological:     Mental Status: She is alert and oriented to person, place, and time.  Psychiatric:        Behavior: Behavior normal.        Thought Content: Thought content normal.        Judgment: Judgment normal.      Assessment/Plan 44 yo with menorrhagia Desires definitive management with endometrial ablation Will proceed with endometrial ablation  Patient was told that it is normal to have menstrual bleeding after an endometrial ablation, only about 80% of patients become amenorrheic, 10% of patients have normal or light periods, and 10% of patients have no change in their bleeding pattern and may need further intervention.  She was told she will observe her periods for a few months after her ablation to see what her periods will be like; it is recommended to wait until at least three months after the procedure before making conclusions about how periods are going to be like after an ablation.    Homero Fellers, MD 05/29/2020, 7:24 AM

## 2020-05-29 NOTE — Anesthesia Preprocedure Evaluation (Signed)
Anesthesia Evaluation  Patient identified by MRN, date of birth, ID band Patient awake    Reviewed: Allergy & Precautions, H&P , NPO status , Patient's Chart, lab work & pertinent test results  History of Anesthesia Complications Negative for: history of anesthetic complications  Airway Mallampati: II  TM Distance: >3 FB Neck ROM: full    Dental  (+) Chipped, Poor Dentition   Pulmonary neg shortness of breath, Current Smoker and Patient abstained from smoking.,    Pulmonary exam normal        Cardiovascular Exercise Tolerance: Good (-) angina(-) Past MI and (-) DOE negative cardio ROS Normal cardiovascular exam     Neuro/Psych negative neurological ROS  negative psych ROS   GI/Hepatic Neg liver ROS, GERD  Medicated and Controlled,  Endo/Other  Hyperthyroidism   Renal/GU      Musculoskeletal   Abdominal   Peds  Hematology negative hematology ROS (+)   Anesthesia Other Findings Past Medical History: 11/2017: BRCA negative     Comment:  MyRisk neg 11/2017: Family history of breast cancer     Comment:  IBIS=13.9%/riskscore=9.3% No date: GERD (gastroesophageal reflux disease) No date: Hyperthyroidism 2018: Left breast mass     Comment:  8/19 fiborepithelial lesion with atypia No date: Vitamin D deficiency  Past Surgical History: 08/03/2018: BREAST BIOPSY; Left     Comment:  Procedure: EXCISION LEFT BREAST MASS;  Surgeon:               Rolm Bookbinder, MD;  Location: [facility];  Service: General;  Laterality: Left; No date: BUNIONECTOMY No date: CESAREAN SECTION     Reproductive/Obstetrics negative OB ROS                             Anesthesia Physical Anesthesia Plan  ASA: II  Anesthesia Plan: General LMA   Post-op Pain Management:    Induction: Intravenous  PONV Risk Score and Plan: Dexamethasone, Ondansetron, Midazolam and Treatment may  vary due to age or medical condition  Airway Management Planned: LMA  Additional Equipment:   Intra-op Plan:   Post-operative Plan: Extubation in OR  Informed Consent: I have reviewed the patients History and Physical, chart, labs and discussed the procedure including the risks, benefits and alternatives for the proposed anesthesia with the patient or authorized representative who has indicated his/her understanding and acceptance.     Dental Advisory Given  Plan Discussed with: Anesthesiologist, CRNA and Surgeon  Anesthesia Plan Comments: (Patient consented for risks of anesthesia including but not limited to:  - adverse reactions to medications - damage to eyes, teeth, lips or other oral mucosa - nerve damage due to positioning  - sore throat or hoarseness - Damage to heart, brain, nerves, lungs, other parts of body or loss of life  Patient voiced understanding.)        Anesthesia Quick Evaluation

## 2020-05-29 NOTE — Discharge Instructions (Signed)
AMBULATORY SURGERY  DISCHARGE INSTRUCTIONS   1) The drugs that you were given will stay in your system until tomorrow so for the next 24 hours you should not:  A) Drive an automobile B) Make any legal decisions C) Drink any alcoholic beverage   2) You may resume regular meals tomorrow.  Today it is better to start with liquids and gradually work up to solid foods.  You may eat anything you prefer, but it is better to start with liquids, then soup and crackers, and gradually work up to solid foods.   3) Please notify your doctor immediately if you have any unusual bleeding, trouble breathing, redness and pain at the surgery site, drainage, fever, or pain not relieved by medication.    4) Additional Instructions:        Please contact your physician with any problems or Same Day Surgery at [phone], Monday through Friday 6 am to 4 pm, or [place] at [facility] number at [phone].Endometrial Ablation, Care After This sheet gives you information about how to care for yourself after your procedure. Your health care provider may also give you more specific instructions. If you have problems or questions, contact your health care provider. What can I expect after the procedure? After the procedure, it is common to have:  A need to urinate more frequently than usual for the first 24 hours.  Cramps similar to menstrual cramps. These may last for 1-2 days.  Thin, watery vaginal discharge that is light pink or brown in color. This may last a few weeks. Discharge will be heavy for the first few days after your procedure. You may need to wear a sanitary pad.  Nausea.  Vaginal bleeding for 4-6 weeks after the procedure, as tissue healing occurs. Follow these instructions at home: Activity  Do not drive for 24 hours if you were given amedicine to help you relax (sedative) during your procedure.  Do not have sex or put anything into your vagina until your health care  provider approves.  Do not lift anything that is heavier than 10 lb (4.5 kg), or the limit that you are told, until your health care provider says that it is safe.  Return to your normal activities as told by your health care provider. Ask your health care provider what activities are safe for you. General instructions   Take over-the-counter and prescription medicines only as told by your health care provider.  Do not take baths, swim, or use a hot tub until your health care provider approves. You will be able to take showers.  Check your vaginal area every day for signs of infection. Check for: ? Redness, swelling, or pain. ? More discharge or blood, instead of less. ? Bad-smelling discharge.  Keep all follow-up visits as told by your health care provider. This is important.  Drink enough fluid to keep your urine pale yellow. Contact a health care provider if you have:  Vaginal redness, swelling, or pain.  Vaginal discharge or bleeding that gets worse instead of getting better.  Bad-smelling vaginal discharge.  A fever or chills.  Trouble urinating. Get help right away if you have:  Heavy vaginal bleeding.  Severe cramps. Summary  After endometrial ablation, it is normal to have thin, watery vaginal discharge that is light pink or brown in color. This may last a few weeks and may be heavier right after the procedure.  Vaginal bleeding is also normal after the procedure and should get better with time.  Check your vaginal area every day for signs of infection, such as bad-smelling discharge.  Keep all follow-up visits as told by your health care provider. This is important. This information is not intended to replace advice given to you by your health care provider. Make sure you discuss any questions you have with your health care provider. Document Revised: 02/15/2019 Document Reviewed: 09/06/2017 Elsevier Patient Education  2020 [employer].

## 2020-05-29 NOTE — Op Note (Signed)
Operative Note  05/29/2020  PRE-OP DIAGNOSIS: Menorrhagia  POST-OP DIAGNOSIS: same   SURGEON: Nameer Summer MD  PROCEDURE: Procedure(s): DILATATION & CURETTAGE/HYSTEROSCOPY WITH NOVASURE ABLATION   ANESTHESIA: Choice   ESTIMATED BLOOD LOSS: 5 cc   SPECIMENS:  Endometrial curettings  FLUID DEFICIT: minimal  COMPLICATIONS: None  DISPOSITION: PACU - hemodynamically stable.  CONDITION: stable  FINDINGS: Exam under anesthesia revealed  normal uterus with bilateral adnexa without masses or fullness. Hysteroscopy revealed normal uterine cavity with bilateral tubal ostia and normal appearing endocervical canal.  PROCEDURE IN DETAIL: After informed consent was obtained, the patient was taken to the operating room where anesthesia was obtained without difficulty. The patient was positioned in the dorsal lithotomy position in [place]. The patient's bladder was catheterized with an in and out foley catheter. The patient was examined under anesthesia, with the above noted findings. The weightedspeculum was placed inside the patient's vagina, and the the anterior lip of the cervix was seen and grasped with the tenaculum.  The uterine cavity was sounded to 10.5cm, and then the cervix was progressively dilated to a 16 French-Pratt dilator. The 0 degree hysteroscope was introduced, with saline fluid used to distend the intrauterine cavity, with the above noted findings.  The hysteroscope was removed.  The uterine cavity was curetted until a gritty texture was noted, yielding endometrial curettings.   The Novasure endometrial ablation device was then placed without difficulty. Measurements were obtained. Patient was noted to have a uterine length of 5.5 cm, a cervical length of 5.5 cm, and a cervical width of 4.8 cm. The device is first tested and after confirmation the procedures performed. Length of procedure was 59.3 seconds. The power was 145. The ablation device is then removed and  repeat hysteroscopy revealed an appropriate lining of the uterus and no perforation or injury.   All instruments were removed, with excellent hemostasis noted throughout. She was then taken out of dorsal lithotomy. Minimal discrepancy in fluid was noted.  The patient tolerated the procedure well. Sponge, lap and needle counts were correct x2. The patient was taken to recovery room in excellent condition.  Adrian Prows MD [facility], [facility] 05/29/2020 9:03 AM

## 2020-05-30 ENCOUNTER — Encounter: Payer: Self-pay | Admitting: Obstetrics and Gynecology

## 2020-05-30 LAB — SURGICAL PATHOLOGY

## 2020-05-30 NOTE — Anesthesia Postprocedure Evaluation (Signed)
Anesthesia Post Note  Patient: Carolyn Nelson  Procedure(s) Performed: DILATATION & CURETTAGE/HYSTEROSCOPY WITH NOVASURE ABLATION (N/A )  Patient location during evaluation: PACU Anesthesia Type: General Level of consciousness: awake and alert Pain management: pain level controlled Vital Signs Assessment: post-procedure vital signs reviewed and stable Respiratory status: spontaneous breathing, nonlabored ventilation, respiratory function stable and patient connected to nasal cannula oxygen Cardiovascular status: blood pressure returned to baseline and stable Postop Assessment: no apparent nausea or vomiting Anesthetic complications: no   No complications documented.   Last Vitals:  Vitals:   05/29/20 0918 05/29/20 0930  BP: (!) 120/102 116/61  Pulse: 72 60  Resp: 18 18  Temp:  36.5 C  SpO2: 97% 100%    Last Pain:  Vitals:   05/29/20 0930  TempSrc: Temporal  PainSc: 2                  Martha Clan

## 2020-05-31 NOTE — Progress Notes (Signed)
This is your pt who saw Dr. Gilman Schmidt.

## 2020-06-09 ENCOUNTER — Encounter: Payer: Self-pay | Admitting: Obstetrics and Gynecology

## 2020-06-09 ENCOUNTER — Ambulatory Visit (INDEPENDENT_AMBULATORY_CARE_PROVIDER_SITE_OTHER): Payer: 59 | Admitting: Obstetrics and Gynecology

## 2020-06-09 ENCOUNTER — Other Ambulatory Visit: Payer: Self-pay

## 2020-06-09 VITALS — BP 116/82 | Wt 142.0 lb

## 2020-06-09 DIAGNOSIS — R87610 Atypical squamous cells of undetermined significance on cytologic smear of cervix (ASC-US): Secondary | ICD-10-CM

## 2020-06-09 DIAGNOSIS — Z4889 Encounter for other specified surgical aftercare: Secondary | ICD-10-CM

## 2020-06-09 DIAGNOSIS — D500 Iron deficiency anemia secondary to blood loss (chronic): Secondary | ICD-10-CM

## 2020-06-09 NOTE — Progress Notes (Signed)
      Postoperative Follow-up Patient presents post op from hysteroscopy, D&C, and Irving Copas sure ablation 2weeks ago for abnormal uterine bleeding.  Subjective: Patient reports some improvement in her preop symptoms. Eating a regular diet without difficulty. The patient is not having any pain.  Activity: normal activities of daily living.  Objective: Blood pressure 116/82, weight 142 lb (64.4 kg), last menstrual period 05/15/2020.  General: NAD, well nourished appears stated age Pulmonary: no increased work of breathing Extremities: no edema Neurologic: normal gait    Admission on 05/29/2020, Discharged on 05/29/2020  Component Date Value Ref Range Status  . ABO/RH(D) 05/29/2020    Final                   Value:A POS Performed at [facility], [address], [place], [place] [address]   . Preg Test, Ur 05/29/2020 NEGATIVE  NEGATIVE Final   Comment:        THE SENSITIVITY OF THIS METHODOLOGY IS >24 mIU/mL   . SURGICAL PATHOLOGY 05/29/2020    Final-Edited                   Value:SURGICAL PATHOLOGY CASE: [id number] PATIENT: Rudine Bernath Surgical Pathology Report     Specimen Submitted: A. Endometrial curettings  Clinical History: Menorrhagia with regular cycle N92.0      DIAGNOSIS: A. ENDOMETRIAL CURETTINGS: - FRAGMENT OF ENDOMETRIAL POLYP. - SECRETORY ENDOMETRIUM. - NEGATIVE FOR ATYPIA / EIN AND MALIGNANCY.  GROSS DESCRIPTION: A. Labeled: EMC Received: Formalin Tissue fragment(s): Multiple Size: Aggregate, 3.1 x 2.7 x 0.4 cm Description: Received on multiple Telfa pads are red and pink soft tissue fragments. Entirely submitted in cassettes 1-2.     Final Diagnosis performed by Quay Burow, MD.   Electronically signed 05/30/2020 2:25:20PM The electronic signature indicates that the named Attending Pathologist has evaluated the specimen Technical component performed at [facility], [address] [place], [place] [address] Lab: [phone] Dir:  Rush Farmer, MD, MMM  Professional component performed at [facility], [facility], [place], [place], [place] [address] Lab: [phone] Dir: Dellia Nims. Rubinas, MD      Assessment: 44 y.o. s/p hysteroscopy, D&C, novasure ablation stable  Plan: Patient has done well after surgery with no apparent complications.  I have discussed the post-operative course to date, and the expected progress moving forward.  The patient understands what complications to be concerned about.  I will see the patient in routine follow up, or sooner if needed.    Activity plan: No restriction.  Discussed ASCUS HPV negative pap results with patient and ASCCP follow up recommendation of repeat pap in 12 months  CBC - anemia on preop CBC with low WBC.  I discussed follow up CBC at time of 6 week postop to see if these values have normalized prior to considering hematology consultation   Malachy Mood, MD, [facility], [place] 06/09/2020, 10:25 AM

## 2020-07-07 ENCOUNTER — Ambulatory Visit: Payer: Self-pay | Admitting: Obstetrics and Gynecology

## 2020-08-07 DIAGNOSIS — N920 Excessive and frequent menstruation with regular cycle: Secondary | ICD-10-CM | POA: Diagnosis present

## 2021-06-18 ENCOUNTER — Telehealth: Payer: 59

## 2021-06-18 NOTE — Telephone Encounter (Signed)
Pt left msg on triage asking for msg to be sent to Dr Gilman Schmidt. She had an ablation about a year ago and just last month she had a cycle. Says it was not a flow, just spotting. Aware CRS out of office until next week and will wait for her response.

## 2021-06-22 NOTE — Telephone Encounter (Signed)
Patient is scheduled for Friday, 07/03/21 at 11:30 with CRS

## 2021-06-22 NOTE — Telephone Encounter (Signed)
Not unusual to have a recurrence of bleeding after an endometrial ablation. She should keep an eye on the bleeding. If it is recurrent, heavy or bothersome she can come for a visit to discuss next steps. If she wants to discuss this more schedule her a telehealth visit. Thank you.

## 2021-07-03 ENCOUNTER — Ambulatory Visit (INDEPENDENT_AMBULATORY_CARE_PROVIDER_SITE_OTHER): Payer: 59 | Admitting: Obstetrics and Gynecology

## 2021-07-03 ENCOUNTER — Encounter: Payer: Self-pay | Admitting: Obstetrics and Gynecology

## 2021-07-03 DIAGNOSIS — N920 Excessive and frequent menstruation with regular cycle: Secondary | ICD-10-CM | POA: Diagnosis not present

## 2021-07-03 DIAGNOSIS — Z9889 Other specified postprocedural states: Secondary | ICD-10-CM

## 2021-07-03 DIAGNOSIS — Z1231 Encounter for screening mammogram for malignant neoplasm of breast: Secondary | ICD-10-CM

## 2021-07-03 NOTE — Progress Notes (Signed)
Virtual Visit via Telephone Note  I connected with Carolyn Nelson on 07/03/21 at 11:30 AM EDT by telephone and verified that I am speaking with the correct person using two identifiers.   I discussed the limitations, risks, security and privacy concerns of performing an evaluation and management service by telephone and the availability of in person appointments. I also discussed with the patient that there may be a patient responsible charge related to this service. The patient expressed understanding and agreed to proceed.  The patient was at home I spoke with the patient from my  office The names of people involved in this encounter were: Dr. Gilman Schmidt and Derrill Kay.  History of Present Illness: Patient had 5-6 day of bleeding  in July and August.  After her ablation and July 2021 she stopped experiencing menstrual cycle bleeding.  She is happy with achieving amenorrhea.  She has been understandably frustrated that her period has returned.  She reports though that the bleeding is manageable and lighter than before.  She would like to avoid any hormonal birth control options.  She reports a negative experience with IUDs in the past.   Observations/Objective:   Physical Exam could not be performed. Because of the COVID-19 outbreak this visit was performed over the phone and not in person.   Assessment and Plan: 45 year old with recurrence of menstrual cycle bleeding after endometrial ablation. We discussed options such as hysterectomy if bleeding worsens.  She will consider this.  We discussed anticipated recovery and time off of work.  If her bleeding increases in heaviness she will call the office so that a pelvic ultrasound can be ordered and follow-up visit will be planned. Mammogram is ordered today.  Follow Up Instructions: As needed   I discussed the assessment and treatment plan with the patient. The patient was provided an opportunity to ask questions and all were answered. The  patient agreed with the plan and demonstrated an understanding of the instructions.   The patient was advised to call back or seek an in-person evaluation if the symptoms worsen or if the condition fails to improve as anticipated.  I provided 10 minutes of non-face-to-face time during this encounter.  Adrian Prows MD [facility], [facility] 07/03/2021 12:13 PM

## 2021-07-20 ENCOUNTER — Other Ambulatory Visit: Payer: Self-pay

## 2021-07-20 ENCOUNTER — Ambulatory Visit
Admission: RE | Admit: 2021-07-20 | Discharge: 2021-07-20 | Disposition: A | Discharge status: Home / Self Care | Payer: 59 | Source: Ambulatory Visit | Attending: Obstetrics and Gynecology | Admitting: Obstetrics and Gynecology

## 2021-07-20 DIAGNOSIS — Z1231 Encounter for screening mammogram for malignant neoplasm of breast: Secondary | ICD-10-CM | POA: Diagnosis present

## 2021-10-29 IMAGING — MG MM DIGITAL SCREENING BILAT W/ TOMO AND CAD
8 series · 9 of 24 positions shown · non-contrast
Comparison: Previous exam(s).

CLINICAL DATA: Screening. Status post excisional biopsy
demonstrating a benign 4.1 cm fibroadenoma.

EXAM:
DIGITAL SCREENING BILATERAL MAMMOGRAM WITH TOMOSYNTHESIS AND CAD
TECHNIQUE: Bilateral screening digital craniocaudal and mediolateral oblique
mammograms were obtained. Bilateral screening digital breast
tomosynthesis was performed. The images were evaluated with
computer-aided detection.

[R CC synth-2D]
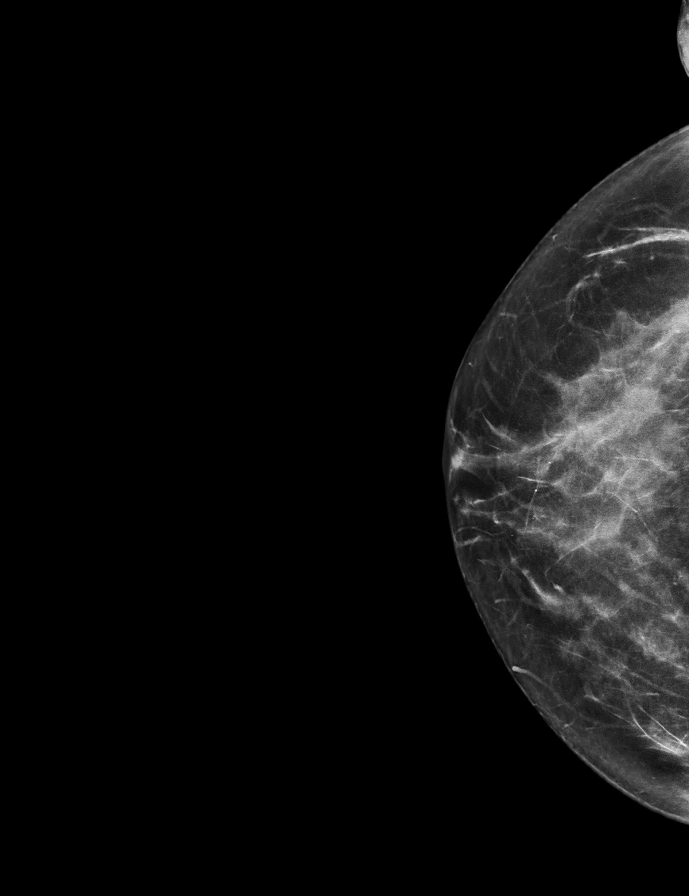

[L CC synth-2D]
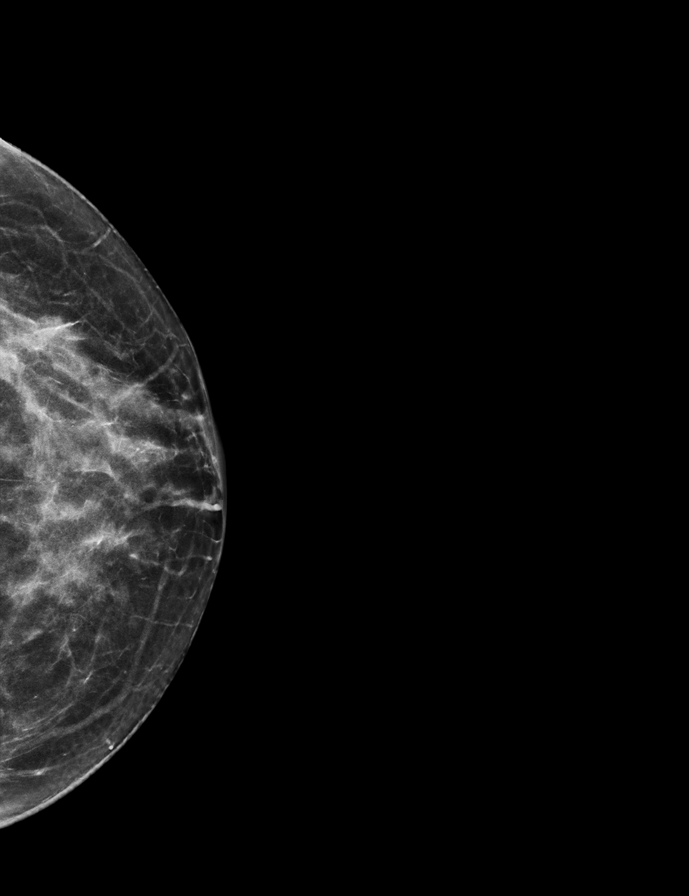

[L MLO synth-2D]
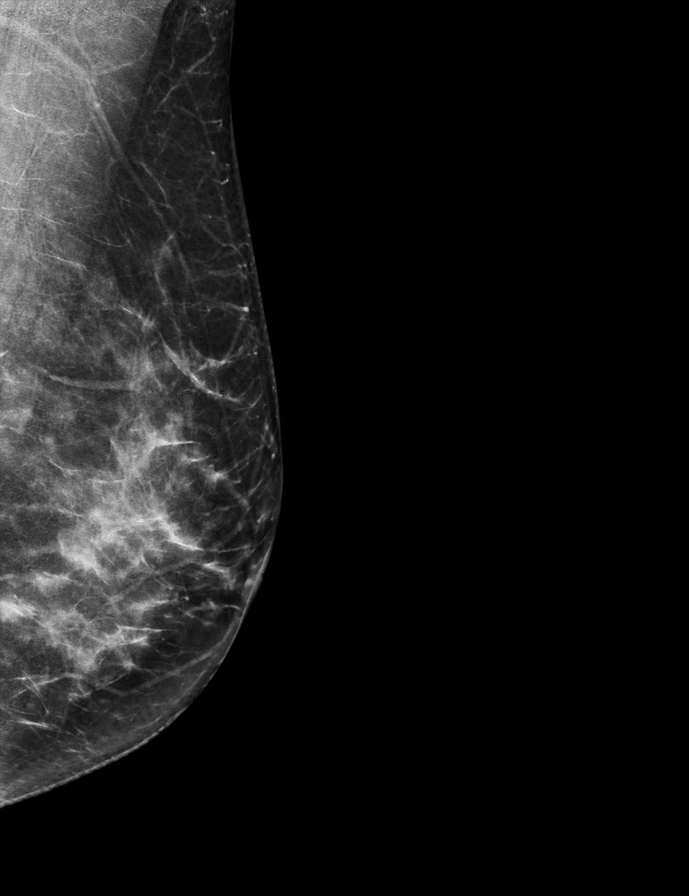

[R MLO synth-2D]
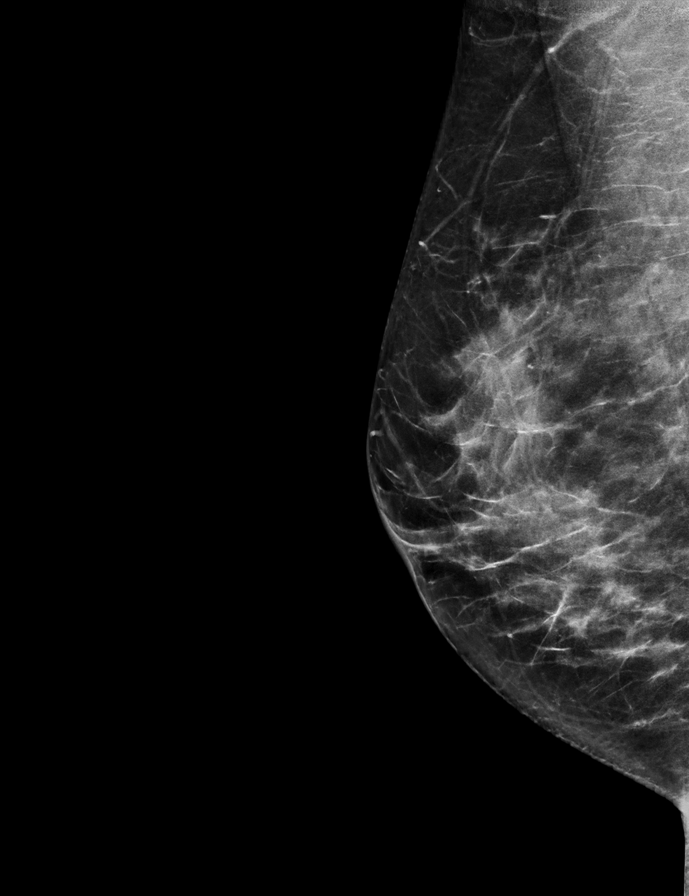

[R CC tomo · 2 of 60 frames shown]
[frame 20/60]
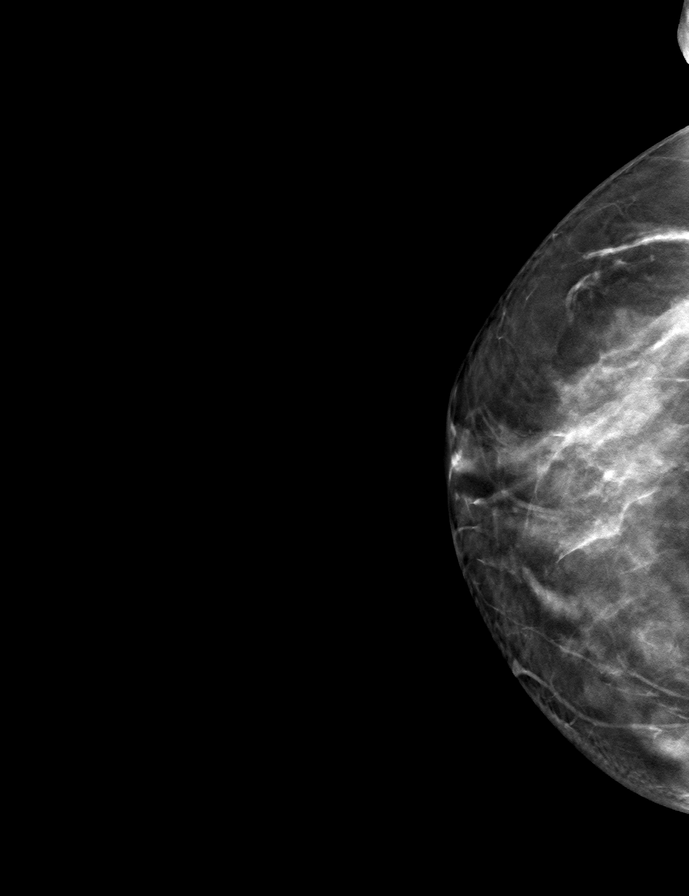
[frame 31/60]
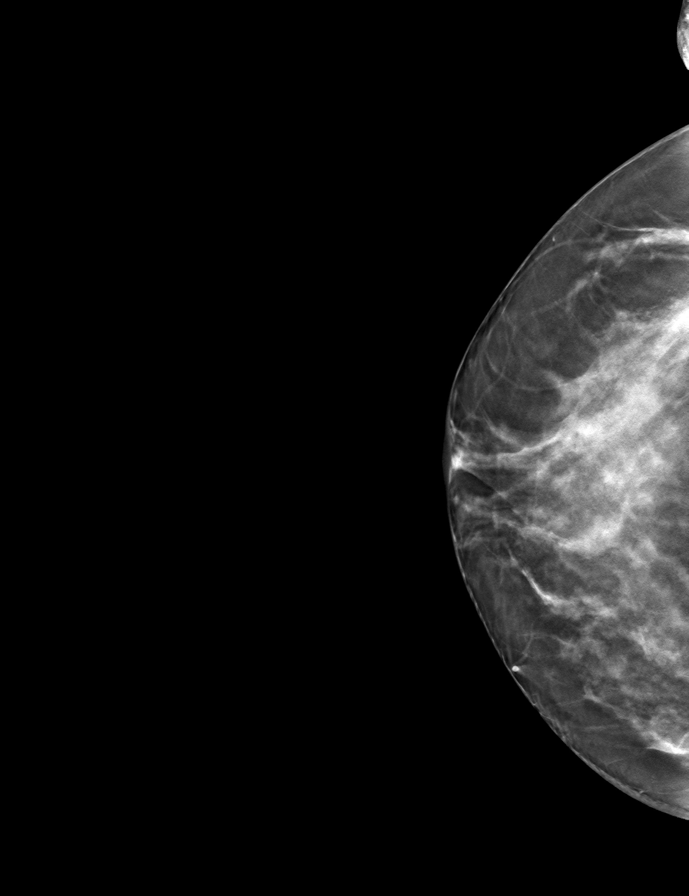

[L MLO tomo · tomo slice 33/65.0]
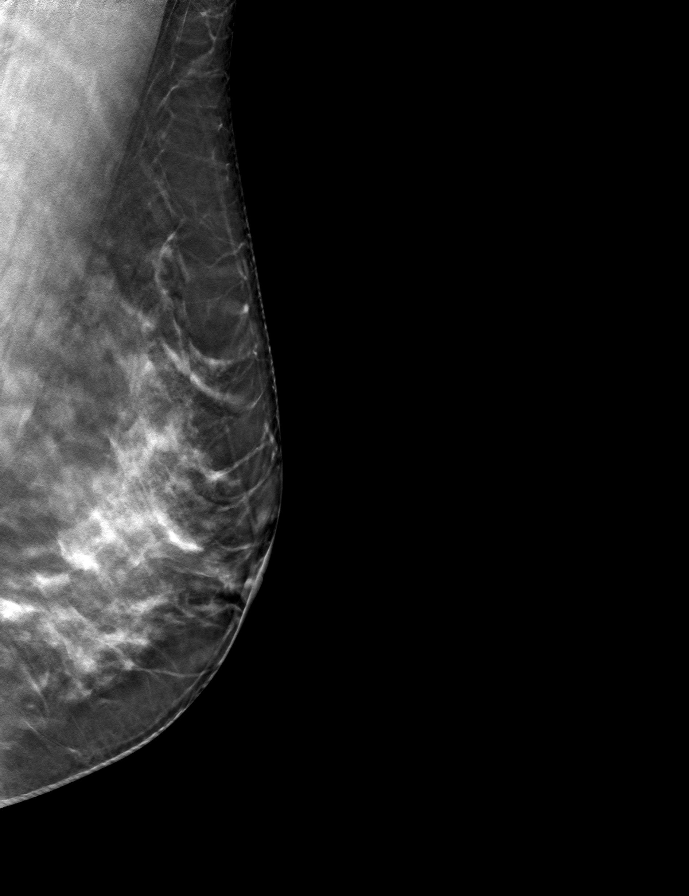

[L CC tomo · tomo slice 29/56.0]
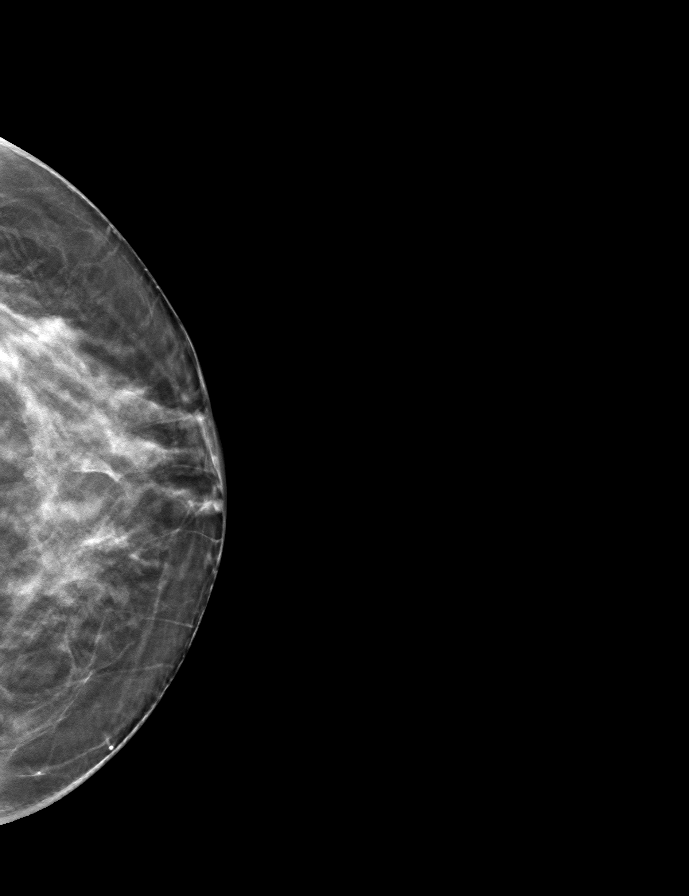

[R MLO tomo · tomo slice 32/63.0]
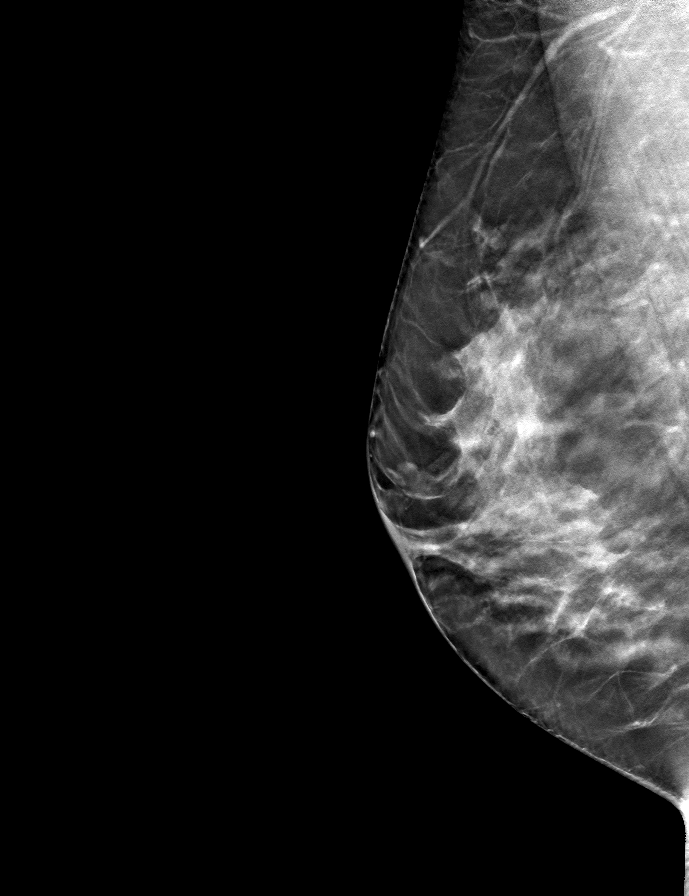

[9 of 24 positions shown; findings below may reference images not displayed]

ACR Breast Density Category c: The breast tissue is heterogeneously
dense, which may obscure small masses.
FINDINGS: There are no findings suspicious for malignancy. Postsurgical
changes of the LEFT breast.
IMPRESSION: No mammographic evidence of malignancy. A result letter of this
screening mammogram will be mailed directly to the patient.

RECOMMENDATION:
Screening mammogram in one year. (Code:BX-9-NK8)

BI-RADS CATEGORY  2: Benign.

## 2022-04-12 ENCOUNTER — Ambulatory Visit
Admission: EM | Admit: 2022-04-12 | Discharge: 2022-04-12 | Disposition: A | Discharge status: Home / Self Care | Payer: 59 | Attending: Emergency Medicine | Admitting: Emergency Medicine

## 2022-04-12 ENCOUNTER — Encounter: Payer: Self-pay | Admitting: Emergency Medicine

## 2022-04-12 DIAGNOSIS — J029 Acute pharyngitis, unspecified: Secondary | ICD-10-CM | POA: Insufficient documentation

## 2022-04-12 LAB — POCT RAPID STREP A (OFFICE): Rapid Strep A Screen: NEGATIVE

## 2022-04-12 NOTE — ED Provider Notes (Signed)
Carolyn Nelson    CSN: [id number] Arrival date & time: 04/12/22  2951      History   Chief Complaint Chief Complaint  Patient presents with   Sore Throat    HPI Carolyn Nelson is a 46 y.o. female.   Presents with sore throat beginning this morning.  Painful to swallow.  Has attempted use of ibuprofen which has been somewhat helpful.  No known contact with similar symptoms.  Has not attempted to eat or drink today.  Denies fever, chills, body aches, congestion, ear pain, headaches, cough, shortness of breath or wheezing.  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    Past Surgical History:  Procedure Laterality Date   BREAST BIOPSY Left 08/03/2018   Procedure: EXCISION LEFT BREAST MASS;  Surgeon: Rolm Bookbinder, MD;  Location: [facility];  Service: General;  Laterality: Left;   BUNIONECTOMY     [place] N/A 05/29/2020   Procedure: DILATATION & CURETTAGE/HYSTEROSCOPY WITH NOVASURE ABLATION;  Surgeon: Homero Fellers, MD;  Location: [facility];  Service: Gynecology;  Laterality: N/A;  105, 5.5 and 50.30 novasure defict 15    OB History     Gravida  4   Para  3   Term  3   Preterm      AB  1   Living  3      SAB  1   IAB      Ectopic      Multiple      Live Births  3            Home Medications    Prior to Admission medications   Medication Sig Start Date End Date Taking?  Authorizing Provider  levothyroxine (SYNTHROID) 100 MCG tablet Take 100 mcg by mouth daily before breakfast.    Yes [provider]  omeprazole (PRILOSEC) 40 MG capsule Take 40 mg by mouth daily.   Yes [provider]  Lactobacillus Casei-Folic Acid (RESTORA RX) 60-1.25 MG CAPS  12/04/20   [provider]  metFORMIN (GLUCOPHAGE-XR) 500 MG 24 hr tablet TAKE 1 TABLET WITH EVENING MEAL BY MOUTH EVERY DAY FOR 30 DAYS    [provider]  phentermine 15 MG capsule Take 15 mg by mouth daily. 01/19/22   [provider]  Vitamin D, Ergocalciferol, (DRISDOL) 1.25 MG (50000 UNIT) CAPS capsule Take 50,000 Units by mouth every 14 (fourteen) days. 12/08/21   [provider]    Family History Family History  Problem Relation Age of Onset   Lung cancer Maternal Grandmother    Lung cancer Maternal Grandfather    Breast cancer Paternal Grandmother 50   Lung cancer Paternal Grandmother    Diabetes Paternal Grandfather     Social History Social History   Tobacco Use   Smoking status: Light Smoker    Packs/day: 0.25    Types: Cigarettes   Smokeless tobacco: Never   Tobacco  comments:    quit 07/2017  Vaping Use   Vaping Use: Never used  Substance Use Topics   Alcohol use: Yes    Alcohol/week: 1.0 standard drink    Types: 1 Glasses of wine per week   Drug use: No     Allergies   Patient has no known allergies.   Review of Systems Review of Systems  Constitutional: Negative.   HENT:  Positive for sore throat. Negative for congestion, dental problem, drooling, ear discharge, ear pain, facial swelling, hearing loss, mouth sores, nosebleeds, postnasal drip, rhinorrhea, sinus pressure, sinus pain, sneezing, tinnitus, trouble swallowing and voice change.   Respiratory: Negative.    Cardiovascular: Negative.     Physical Exam Triage Vital Signs ED Triage Vitals [04/12/22 0937]  Enc Vitals Group     BP 108/72     Pulse Rate 89     Resp 16      Temp 98.4 F (36.9 C)     Temp Source Oral     SpO2 98 %     Weight      Height      Head Circumference      Peak Flow      Pain Score 4     Pain Loc      Pain Edu?      Excl. in [name]?    No data found.  Updated Vital Signs BP 108/72 (BP Location: Left Arm)   Pulse 89   Temp 98.4 F (36.9 C) (Oral)   Resp 16   LMP 04/12/2022   SpO2 98%   Visual Acuity Right Eye Distance:   Left Eye Distance:   Bilateral Distance:    Right Eye Near:   Left Eye Near:    Bilateral Near:     Physical Exam Constitutional:      Appearance: She is well-developed.  HENT:     Right Ear: Tympanic membrane and ear canal normal.     Left Ear: Tympanic membrane and ear canal normal.     Nose: No congestion or rhinorrhea.     Mouth/Throat:     Mouth: Mucous membranes are moist.     Pharynx: Oropharynx is clear.     Tonsils: No tonsillar exudate. 0 on the right. 0 on the left.  Pulmonary:     Effort: Pulmonary effort is normal.  Musculoskeletal:     Cervical back: Normal range of motion and neck supple.  Skin:    General: Skin is warm and dry.  Neurological:     General: No focal deficit present.     Mental Status: She is alert and oriented to person, place, and time.  Psychiatric:        Mood and Affect: Mood normal.        Behavior: Behavior normal.     UC Treatments / Results  Labs (all labs ordered are listed, but only abnormal results are displayed) Labs Reviewed  POCT RAPID STREP A (OFFICE)    EKG   Radiology No results found.  Procedures Procedures (including critical care time)  Medications Ordered in UC Medications - No data to display  Initial Impression / Assessment and Plan / UC Course  I have reviewed the triage vital signs and the nursing notes.  Pertinent labs & imaging results that were available during my care of the patient were reviewed by me and considered in my medical decision making (see chart for details).  Sore throat  Vital signs are  stable, patient  is in no signs of distress, rapid strep test negative, sent for culture, discussed findings with patient, recommended over-the-counter supportive care and home remedies for management of symptoms, may follow-up with urgent care as needed Final Clinical Impressions(s) / UC Diagnoses   Final diagnoses:  None   Discharge Instructions   None    ED Prescriptions   None    PDMP not reviewed this encounter.   Hans Eden, [place] 04/12/22 [phone]

## 2022-04-12 NOTE — ED Triage Notes (Signed)
Pt presents with a ST that started this morning.

## 2022-04-12 NOTE — Discharge Instructions (Addendum)
Your rapid strep test today was negative, your throat sample has been sent to the lab to see if it will grow bacteria, if this occurs you will be notified and antibiotic be sent in for use  In the meantime we must treat this as a viral symptom and manage the symptoms   May use ibuprofen or Tylenol every 6 hours as needed  May attempt use of salt water gargles, throat lozenges, warm liquids, teaspoons of honey and soft foods for support  You may follow-up at urgent care as needed

## 2022-04-14 LAB — CULTURE, GROUP A STREP (THRC)

## 2022-11-11 ENCOUNTER — Other Ambulatory Visit (HOSPITAL_COMMUNITY)
Admission: RE | Admit: 2022-11-11 | Discharge: 2022-11-11 | Disposition: A | Discharge status: Home / Self Care | Payer: 59 | Source: Ambulatory Visit | Attending: Obstetrics & Gynecology | Admitting: Obstetrics & Gynecology

## 2022-11-11 ENCOUNTER — Ambulatory Visit (INDEPENDENT_AMBULATORY_CARE_PROVIDER_SITE_OTHER): Payer: 59 | Admitting: Obstetrics & Gynecology

## 2022-11-11 ENCOUNTER — Encounter: Payer: Self-pay | Admitting: Obstetrics & Gynecology

## 2022-11-11 VITALS — BP 120/70 | Ht 64.0 in | Wt 149.0 lb

## 2022-11-11 DIAGNOSIS — R6882 Decreased libido: Secondary | ICD-10-CM | POA: Diagnosis not present

## 2022-11-11 DIAGNOSIS — Z1231 Encounter for screening mammogram for malignant neoplasm of breast: Secondary | ICD-10-CM

## 2022-11-11 DIAGNOSIS — Z124 Encounter for screening for malignant neoplasm of cervix: Secondary | ICD-10-CM

## 2022-11-11 DIAGNOSIS — R300 Dysuria: Secondary | ICD-10-CM | POA: Diagnosis not present

## 2022-11-11 DIAGNOSIS — N924 Excessive bleeding in the premenopausal period: Secondary | ICD-10-CM | POA: Diagnosis not present

## 2022-11-11 LAB — POCT URINALYSIS DIPSTICK
Bilirubin, UA: NEGATIVE
Blood, UA: POSITIVE
Glucose, UA: NEGATIVE
Ketones, UA: NEGATIVE
Nitrite, UA: NEGATIVE
Protein, UA: NEGATIVE
Spec Grav, UA: 1.015 (ref 1.010–1.025)
Urobilinogen, UA: 1 E.U./dL
pH, UA: 6 (ref 5.0–8.0)

## 2022-11-11 MED ORDER — CIPROFLOXACIN HCL 500 MG PO TABS: 500.0000 mg | ORAL_TABLET | Freq: Two times a day (BID) | ORAL | 0 refills | Status: DC

## 2022-11-11 MED ORDER — FLUCONAZOLE 150 MG PO TABS: 150.0000 mg | ORAL_TABLET | Freq: Once | ORAL | 0 refills | Status: AC

## 2022-11-11 NOTE — Progress Notes (Signed)
   Established Patient Office Visit  Subjective   Patient ID: Carolyn Nelson, female    DOB: Jan 12, 1976  Age: 47 y.o. MRN: [id number]  Chief Complaint  Patient presents with   Dysuria   Dysmenorrhea   Menstrual Problem    Pt had an ablation, no periods for 1 year then periods came back lighter for 8 months, her last period was in August. Pt also thinks she may have a uti. Had painful intercourse last night.    HPI   47 yo P3 here with several issues. She needs a pap smear.  She has a 1 day h/o severe dysuria, thinks that she has a UTI. She has used cipro in the past with good results and no problems.  She had an ablation in 2021 and had no bleeding for a year. She then bled every 4 weeks for about 8 months. She had no bleeding since 06/2022 until about 2 weeks ago when she had some light spotting. She denies hot flashes.  She also is  concerned about decreased libido and would like med. Treatment. She had an appt in [place] for testosterone pellets but she "chickened out." She would like to try test. Ointment. Objective:     BP 120/70   Ht '5\' 4"'$  (1.626 m)   Wt 149 lb (67.6 kg)   BMI 25.58 kg/m    Physical Exam   Results for orders placed or performed in visit on 11/11/22  POCT urinalysis dipstick  Result Value Ref Range   Color, UA     Clarity, UA     Glucose, UA Negative Negative   Bilirubin, UA Negative    Ketones, UA Negative    Spec Grav, UA 1.015 1.010 - 1.025   Blood, UA Positive    pH, UA 6.0 5.0 - 8.0   Protein, UA Negative Negative   Urobilinogen, UA 1.0 0.2 or 1.0 E.U./dL   Nitrite, UA Negative    Leukocytes, UA Large (3+) (A) Negative   Appearance     Odor       Well nourished, well hydrated White female, no apparent distress She is ambulating and conversing normally.  Normal female without lesion, discharge or tenderness, normal size and shape, anteverted, normal adnexal exam   Assessment & Plan:   Problem List Items Addressed This Visit     Dysuria    Relevant Orders   Urine Culture   POCT urinalysis dipstick (Completed)   Other Visit Diagnoses     Breast cancer screening by mammogram    -  Primary   Relevant Orders   MM DIGITAL SCREENING BILATERAL   Abnormal perimenopausal bleeding       Relevant Orders   [facility]   US PELVIC COMPLETE WITH TRANSVAGINAL   Screening for cervical cancer       Relevant Orders   Cytology - PAP     She requested a prescription for diflucan for prn use. She will come back in a year or prn sooner   Emily Filbert, MD

## 2022-11-12 DIAGNOSIS — R6882 Decreased libido: Secondary | ICD-10-CM | POA: Insufficient documentation

## 2022-11-12 LAB — FOLLICLE STIMULATING HORMONE: FSH: 7.6 m[IU]/mL

## 2022-11-12 MED ORDER — TESTOSTERONE 1.62 % TD GEL: TRANSDERMAL | 4 refills | Status: DC

## 2022-11-15 LAB — URINE CULTURE

## 2022-11-16 LAB — CYTOLOGY - PAP
Comment: NEGATIVE
Diagnosis: NEGATIVE
High risk HPV: NEGATIVE

## 2022-11-17 ENCOUNTER — Encounter: Payer: Self-pay | Admitting: Obstetrics & Gynecology

## 2022-11-17 ENCOUNTER — Other Ambulatory Visit: Payer: Self-pay | Admitting: Obstetrics & Gynecology

## 2022-11-17 MED ORDER — SULFAMETHOXAZOLE-TRIMETHOPRIM 800-160 MG PO TABS: 1.0000 | ORAL_TABLET | Freq: Two times a day (BID) | ORAL | 0 refills | Status: DC

## 2022-11-17 NOTE — Progress Notes (Signed)
Bactrim ds prescribed for UTI on culture

## 2022-11-24 ENCOUNTER — Ambulatory Visit: Payer: 59 | Admitting: Obstetrics & Gynecology

## 2022-12-02 NOTE — Telephone Encounter (Signed)
Called pt, she did pick the new Rx on 11/17/22 but did NOT take it since her sx improved and cleared up.

## 2022-12-20 ENCOUNTER — Ambulatory Visit: Payer: 59 | Admitting: Obstetrics and Gynecology

## 2023-09-13 ENCOUNTER — Encounter: Payer: Self-pay | Admitting: Obstetrics and Gynecology

## 2023-09-13 ENCOUNTER — Ambulatory Visit (INDEPENDENT_AMBULATORY_CARE_PROVIDER_SITE_OTHER): Payer: 59 | Admitting: Obstetrics and Gynecology

## 2023-09-13 VITALS — BP 106/74 | Ht 64.0 in | Wt 143.0 lb

## 2023-09-13 DIAGNOSIS — K5904 Chronic idiopathic constipation: Secondary | ICD-10-CM

## 2023-09-13 DIAGNOSIS — N898 Other specified noninflammatory disorders of vagina: Secondary | ICD-10-CM

## 2023-09-13 DIAGNOSIS — B9689 Other specified bacterial agents as the cause of diseases classified elsewhere: Secondary | ICD-10-CM | POA: Diagnosis not present

## 2023-09-13 DIAGNOSIS — N760 Acute vaginitis: Secondary | ICD-10-CM | POA: Diagnosis not present

## 2023-09-13 DIAGNOSIS — N939 Abnormal uterine and vaginal bleeding, unspecified: Secondary | ICD-10-CM | POA: Diagnosis not present

## 2023-09-13 DIAGNOSIS — E890 Postprocedural hypothyroidism: Secondary | ICD-10-CM

## 2023-09-13 LAB — POCT WET PREP WITH KOH
KOH Prep POC: POSITIVE — AB
RBC Wet Prep HPF POC: POSITIVE
Trichomonas, UA: NEGATIVE
Yeast Wet Prep HPF POC: NEGATIVE

## 2023-09-13 MED ORDER — METRONIDAZOLE 500 MG PO TABS: ORAL_TABLET | ORAL | 0 refills | Status: DC

## 2023-09-13 MED ORDER — FLUCONAZOLE 150 MG PO TABS: 150.0000 mg | ORAL_TABLET | Freq: Once | ORAL | 0 refills | Status: AC

## 2023-09-13 NOTE — Patient Instructions (Signed)
I value your feedback and you entrusting us with your care. If you get a [facility] patient survey, I would appreciate you taking the time to let us know about your experience today. Thank you! ? ? ?

## 2023-09-13 NOTE — Progress Notes (Signed)
Carolyn Nelson, Carolyn Sorrel, PA-C   Chief Complaint  Patient presents with   Vaginal Bleeding    Spotting for the last month, only when wipes.   Vaginal Odor    Little discharge and itching x 2 weeks. Pt also states Carolyn Nelson's not having bowel movements like Carolyn Nelson should and when Carolyn Nelson does have them Carolyn Nelson is not constipated.    HPI:      Carolyn Nelson is a 47 y.o. [phone] whose LMP was No LMP recorded., presents today for several issues.  Pt with increased vag d/c with odor, itching, dysuria for a few wks; no urin frequency/urgency. Had a sinus infection and did teledoc appt. Given cephalexin for sinusitis and possible UTI (per pt sx and self-reported dx). Vag sx have persisted. Carolyn Nelson is not sexually active, no new partners. No meds to treat, no new soaps/detergents. Has been on multiple abx per pt report.  Pt is s/p D&C/hysteroscopy/novasura ablation 7/21; hx of menorrhagia with regular cycle. Was amenorrheic for a year but now with monthly menses, lasting 4-5 days, very light, no BTB, mild dsymen. Has had daily spotting with wiping only for 2 wks (started mid-cycle) with mild dysmen. Unusual for pt.  Hx of hyperthyroidism, now on levo 88 mcg. Had dose change earlier in the year and was euthyroid again (I can't see labs in Epic) this summer.  Pt with hx of constipation but worse past 6 months or so. No relief with fiber supp daily, drinking lots of water. Hasn't tried stool softener. Has done miralax with temporary relief. Also taking pre and probiotic.  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   Past Medical History:  Diagnosis Date   BRCA negative 11/2017   MyRisk neg   Family history of breast cancer 11/2017   IBIS=13.9%/riskscore=9.3%   GERD (gastroesophageal reflux disease)     Hyperthyroidism    Left breast mass 2018   8/19 fiborepithelial lesion with atypia   Vitamin D deficiency      Past Surgical History:  Procedure Laterality Date   BREAST BIOPSY Left 08/03/2018   Procedure: EXCISION LEFT BREAST MASS;  Surgeon: Emelia Loron, MD;  Location: [facility];  Service: General;  Laterality: Left;   BUNIONECTOMY     CESAREAN SECTION     DILITATION & CURRETTAGE/HYSTROSCOPY WITH NOVASURE ABLATION N/A 05/29/2020   Procedure: DILATATION & CURETTAGE/HYSTEROSCOPY WITH NOVASURE ABLATION;  Surgeon: Natale Milch, MD;  Location: [facility];  Service: Gynecology;  Laterality: N/A;  105, 5.5 and 50.30 novasure defict 15    Family History  Problem Relation Age of Onset   Lung cancer Maternal Grandmother    Lung cancer Maternal Grandfather    Breast cancer Paternal Grandmother 24   Lung cancer Paternal Grandmother    Diabetes Paternal Grandfather     Social History   Socioeconomic History   Marital status: Married    Spouse name: Not on file   Number of children: Not on file   Years of education: Not on file   Highest education level: Not on file  Occupational History   Not on file  Tobacco Use   Smoking status: Former    Current packs/day: 0.25    Types: Cigarettes   Smokeless tobacco: Never   Tobacco comments:    quit 07/2017  Vaping Use   Vaping status: Never Used  Substance and Sexual Activity   Alcohol use: Yes    Alcohol/week: 1.0 standard drink of alcohol    Types: 1 Glasses of wine per week    Comment: soc   Drug use: No   Sexual activity: Yes    Birth control/protection: None, Surgical    Comment: Vasectomy  Other Topics Concern   Not on file  Social History Narrative   Not on file   Social Determinants of Health   Financial Resource Strain: Not on file  Food Insecurity: Not on file  Transportation Needs: Not on file  Physical Activity: Not on file  Stress: Not on file  Social Connections: Not on file   Intimate Partner Violence: Low Risk  (08/01/2021)   Received from [facility]   Intimate Partner Violence    Insults You: Not on file    Threatens You: Not on file    Screams at You: Not on file    Physically Hurt: Not on file    Intimate Partner Violence Score: Not on file    Outpatient Medications Prior to Visit  Medication Sig Dispense Refill   albuterol (VENTOLIN HFA) 108 (90 Base) MCG/ACT inhaler Inhale into the lungs.     levothyroxine (SYNTHROID) 88 MCG tablet Take 88 mcg by mouth every morning.     omeprazole (PRILOSEC) 40 MG capsule Take 40 mg by mouth daily.     Vitamin D, Ergocalciferol, (DRISDOL) 1.25 MG (50000 UNIT) CAPS capsule Take 50,000 Units by mouth every 14 (fourteen) days.     ciprofloxacin (CIPRO) 500 MG tablet Take 1 tablet (500 mg total) by mouth 2 (two) times daily. 6 tablet 0   Lactobacillus Casei-Folic Acid (RESTORA RX) 60-1.25 MG CAPS  (Patient not taking: Reported on 11/11/2022)     levothyroxine (SYNTHROID) 100 MCG tablet Take 100 mcg by mouth daily before breakfast.      metFORMIN (GLUCOPHAGE-XR) 500 MG 24 hr tablet TAKE 1 TABLET WITH EVENING MEAL BY MOUTH EVERY DAY FOR 30 DAYS (Patient not taking: Reported on 11/11/2022)     phentermine 15 MG capsule Take 15 mg by mouth daily. (Patient not taking: Reported on 11/11/2022)     sulfamethoxazole-trimethoprim (BACTRIM DS) 800-160 MG tablet Take 1 tablet by mouth 2 (two) times daily. 14 tablet 0   Testosterone 1.62 % GEL Apply small amount twice weekly at bedtime 75 g 4   No facility-administered medications prior to visit.      ROS:  Review of Systems  Constitutional:  Negative for fever.  Gastrointestinal:  Positive for constipation. Negative for blood in stool, diarrhea, nausea and vomiting.  Genitourinary:  Positive for vaginal bleeding and vaginal discharge. Negative for dyspareunia, dysuria, flank pain, frequency, hematuria, urgency and vaginal pain.  Musculoskeletal:  Negative for back  pain.  Skin:  Negative for rash.   BREAST: No symptoms   OBJECTIVE:   Vitals:  BP 106/74   Ht 5\' 4"  (1.626 m)   Wt 143 lb (64.9 kg)   BMI 24.55 kg/m   Physical Exam Vitals reviewed.  Constitutional:      Appearance: Carolyn Nelson is well-developed.  Pulmonary:     Effort: Pulmonary effort is normal.  Genitourinary:    General: Normal vulva.     Pubic Area: No rash.      Labia:        Right: No rash, tenderness or lesion.        Left: No rash, tenderness  or lesion.      Vagina: Vaginal discharge present. No erythema or tenderness.     Cervix: Normal.     Uterus: Normal. Not enlarged and not tender.      Adnexa: Right adnexa normal and left adnexa normal.       Right: No mass or tenderness.         Left: No mass or tenderness.       Comments: BROWNISH MILKY VAG D/C Musculoskeletal:        General: Normal range of motion.     Cervical back: Normal range of motion.  Skin:    General: Skin is warm and dry.  Neurological:     General: No focal deficit present.     Mental Status: Carolyn Nelson is alert and oriented to person, place, and time.  Psychiatric:        Mood and Affect: Mood normal.        Behavior: Behavior normal.        Thought Content: Thought content normal.        Judgment: Judgment normal.     Results: Results for orders placed or performed in visit on 09/13/23 (from the past 24 hour(s))  POCT Wet Prep with KOH     Status: Abnormal   Collection Time: 09/13/23 12:04 PM  Result Value Ref Range   Trichomonas, UA Negative    Clue Cells Wet Prep HPF POC few    Epithelial Wet Prep HPF POC     Yeast Wet Prep HPF POC neg    Bacteria Wet Prep HPF POC     RBC Wet Prep HPF POC pos    WBC Wet Prep HPF POC     KOH Prep POC Positive (A) Negative     Assessment/Plan: Abnormal uterine bleeding (AUB)--for 2 wks this cycle. Check thyroid labs, if WNL, will follow sx next cycle. May have been due to abx use/sickness.   BV (bacterial vaginosis) - Plan: metroNIDAZOLE (FLAGYL)  500 MG tablet, POCT Wet Prep with KOH; pos sx and wet prep, Rx flagyl. F/u prn.   Vaginal irritation - Plan: fluconazole (DIFLUCAN) 150 MG tablet, POCT Wet Prep with KOH; neg exam/wet prep but Rx diflucan given sx and frequent abx use recently.   Chronic idiopathic constipation--cont pre/probiotics, add colace. Rule out thyroid. Can do miralax daily prn. F/u prn.   Postoperative hypothyroidism - Plan: TSH + free T4; check labs. Will f/u with results.    Meds ordered this encounter  Medications   metroNIDAZOLE (FLAGYL) 500 MG tablet    Sig: Take 1 tab BID for 7 days; NO alcohol use for 10 days after prescription start    Dispense:  14 tablet    Refill:  0    Order Specific Question:   Supervising Provider    Answer:   Hildred Laser [[id number]]   fluconazole (DIFLUCAN) 150 MG tablet    Sig: Take 1 tablet (150 mg total) by mouth once for 1 dose.    Dispense:  1 tablet    Refill:  0    Order Specific Question:   Supervising Provider    Answer:   Hildred Laser [[id number]]      Return if symptoms worsen or fail to improve.  Makenzey Nanni B. Aldrich Lloyd, PA-C 09/13/2023 12:04 PM

## 2023-09-14 LAB — TSH+FREE T4
Free T4: 2.06 ng/dL — ABNORMAL HIGH (ref 0.82–1.77)
TSH: 0.978 u[IU]/mL (ref 0.450–4.500)

## 2023-09-14 NOTE — Addendum Note (Signed)
Addended by: Althea Grimmer B on: 09/14/2023 05:39 PM   Modules accepted: Orders

## 2023-10-12 NOTE — Progress Notes (Unsigned)
PCP:  Rica Records, PA-C   No chief complaint on file.  SEE AUB NOTE  HPI:      [name] is a 47 y.o. [phone] who LMP was No LMP recorded., presents today for her annual examination.  Her menses are regular every 28-30 days, lasting 3 days.  Dysmenorrhea mild, occurring first 1-2 days of flow. She does not have intermenstrual bleeding.  Sex activity: single partner, contraception - vasectomy.  Last Pap: 11/11/22  Results were: no abnormalities /neg HPV DNA  Hx of STDs: none  Last mammogram: 07/20/21  Results were: normal, repeat in 12 months. Hx of fibroadenoma on LT breast bx 2019. There is a FH of breast cancer in her PGM, genetic testing not done. There is no FH of ovarian cancer. The patient does do self-breast exams.  Tobacco use: The patient denies current or previous tobacco use. Alcohol use: social drinker No drug use.  Exercise: minimally active  Colonoscopy:  She does get adequate calcium and Vitamin D in her diet.  Pt stopped smoking last yr and has gained wt since then. She is really upset about her current weight. She has tried keto diet but would like to try phentermine again to help her "jump start" wt loss. She did it in past for a few months with some wt loss.    Past Medical History:  Diagnosis Date   BRCA negative 11/2017   MyRisk neg   Family history of breast cancer 11/2017   IBIS=13.9%/riskscore=9.3%   GERD (gastroesophageal reflux disease)    Hyperthyroidism    Left breast mass 2018   8/19 fiborepithelial lesion with atypia   Vitamin D deficiency     Past Surgical History:  Procedure Laterality Date   BREAST BIOPSY Left 08/03/2018   Procedure: EXCISION LEFT BREAST MASS;  Surgeon: Emelia Loron, MD;  Location: [facility];  Service: General;  Laterality: Left;   BUNIONECTOMY     CESAREAN SECTION     DILITATION & CURRETTAGE/HYSTROSCOPY WITH NOVASURE ABLATION N/A 05/29/2020   Procedure: DILATATION &  CURETTAGE/HYSTEROSCOPY WITH NOVASURE ABLATION;  Surgeon: Natale Milch, MD;  Location: [facility];  Service: Gynecology;  Laterality: N/A;  105, 5.5 and 50.30 novasure defict 15    Family History  Problem Relation Age of Onset   Lung cancer Maternal Grandmother    Lung cancer Maternal Grandfather    Breast cancer Paternal Grandmother 58   Lung cancer Paternal Grandmother    Diabetes Paternal Grandfather     Social History   Socioeconomic History   Marital status: Married    Spouse name: Not on file   Number of children: Not on file   Years of education: Not on file   Highest education level: Not on file  Occupational History   Not on file  Tobacco Use   Smoking status: Former    Current packs/day: 0.25    Types: Cigarettes   Smokeless tobacco: Never   Tobacco comments:    quit 07/2017  Vaping Use   Vaping status: Never Used  Substance and Sexual Activity   Alcohol use: Yes    Alcohol/week: 1.0 standard drink of alcohol    Types: 1 Glasses of wine per week    Comment: soc   Drug use: No   Sexual activity: Yes    Birth control/protection: None, Surgical    Comment: Vasectomy  Other Topics Concern   Not on file  Social History Narrative   Not on  file   Social Determinants of Health   Financial Resource Strain: Not on file  Food Insecurity: Not on file  Transportation Needs: Not on file  Physical Activity: Not on file  Stress: Not on file (09/16/2023)  Social Connections: Not on file  Intimate Partner Violence: Low Risk  (08/01/2021)   Received from [facility]   Intimate Partner Violence    Insults You: Not on file    Threatens You: Not on file    Screams at You: Not on file    Physically Hurt: Not on file    Intimate Partner Violence Score: Not on file    No outpatient medications have been marked as taking for the 10/13/23 encounter (Appointment) with Eithen Castiglia, Ilona Sorrel, PA-C.     ROS:  Review of Systems  Constitutional:   Negative for fatigue, fever and unexpected weight change.  Respiratory:  Negative for cough, shortness of breath and wheezing.   Cardiovascular:  Negative for chest pain, palpitations and leg swelling.  Gastrointestinal:  Positive for constipation. Negative for blood in stool, diarrhea, nausea and vomiting.  Endocrine: Negative for cold intolerance, heat intolerance and polyuria.  Genitourinary:  Negative for dyspareunia, dysuria, flank pain, frequency, genital sores, hematuria, menstrual problem, pelvic pain, urgency, vaginal bleeding, vaginal discharge and vaginal pain.  Musculoskeletal:  Negative for back pain, joint swelling and myalgias.  Skin:  Negative for rash.  Neurological:  Negative for dizziness, syncope, light-headedness, numbness and headaches.  Hematological:  Negative for adenopathy.  Psychiatric/Behavioral:  Negative for agitation, confusion, sleep disturbance and suicidal ideas. The patient is not nervous/anxious.      Objective: There were no vitals taken for this visit.   Physical Exam Constitutional:      Appearance: She is well-developed.  Genitourinary:     No vaginal discharge, erythema or tenderness.      Right Adnexa: not tender and no mass present.    Left Adnexa: not tender and no mass present.    No cervical motion tenderness or polyp.     Uterus is not enlarged or tender.  Breasts:    Right: No mass, nipple discharge, skin change or tenderness.     Left: No mass, nipple discharge, skin change or tenderness.  Neck:     Thyroid: No thyromegaly.  Cardiovascular:     Rate and Rhythm: Normal rate and regular rhythm.     Heart sounds: Normal heart sounds. No murmur heard. Pulmonary:     Effort: Pulmonary effort is normal.     Breath sounds: Normal breath sounds.  Abdominal:     Palpations: Abdomen is soft.     Tenderness: There is no abdominal tenderness. There is no guarding.  Musculoskeletal:        General: Normal range of motion.     Cervical  back: Normal range of motion.  Neurological:     Mental Status: She is alert and oriented to person, place, and time.     Cranial Nerves: No cranial nerve deficit.  Psychiatric:        Behavior: Behavior normal.  Vitals reviewed.     Assessment/Plan: No diagnosis found.  No orders of the defined types were placed in this encounter.            GYN counsel breast self exam, mammography screening, adequate intake of calcium and vitamin D, diet and exercise     F/U  No follow-ups on file.  Satin Boal B. Marwa Fuhrman, PA-C 10/12/2023 2:42 PM

## 2023-10-13 ENCOUNTER — Ambulatory Visit (INDEPENDENT_AMBULATORY_CARE_PROVIDER_SITE_OTHER): Payer: 59 | Admitting: Obstetrics and Gynecology

## 2023-10-13 ENCOUNTER — Encounter: Payer: Self-pay | Admitting: Obstetrics and Gynecology

## 2023-10-13 VITALS — BP 100/66 | Ht 64.0 in | Wt 153.0 lb

## 2023-10-13 DIAGNOSIS — Z1231 Encounter for screening mammogram for malignant neoplasm of breast: Secondary | ICD-10-CM

## 2023-10-13 DIAGNOSIS — R899 Unspecified abnormal finding in specimens from other organs, systems and tissues: Secondary | ICD-10-CM

## 2023-10-13 DIAGNOSIS — Z803 Family history of malignant neoplasm of breast: Secondary | ICD-10-CM | POA: Insufficient documentation

## 2023-10-13 DIAGNOSIS — Z01419 Encounter for gynecological examination (general) (routine) without abnormal findings: Secondary | ICD-10-CM

## 2023-10-13 DIAGNOSIS — Z1211 Encounter for screening for malignant neoplasm of colon: Secondary | ICD-10-CM

## 2023-10-13 NOTE — Patient Instructions (Signed)
I value your feedback and you entrusting us with your care. If you get a [facility] patient survey, I would appreciate you taking the time to let us know about your experience today. Thank you!  [facility] ([place])--[phone]  

## 2023-10-14 LAB — T4, FREE: Free T4: 1.19 ng/dL (ref 0.82–1.77)

## 2023-10-14 LAB — TSH: TSH: 2.82 u[IU]/mL (ref 0.450–4.500)

## 2023-12-05 ENCOUNTER — Other Ambulatory Visit: Payer: Self-pay

## 2023-12-05 ENCOUNTER — Ambulatory Visit
Admission: RE | Admit: 2023-12-05 | Discharge: 2023-12-05 | Disposition: A | Discharge status: Home / Self Care | Payer: 59 | Source: Ambulatory Visit | Attending: Emergency Medicine | Admitting: Emergency Medicine

## 2023-12-05 ENCOUNTER — Telehealth: Payer: Self-pay | Admitting: Emergency Medicine

## 2023-12-05 ENCOUNTER — Ambulatory Visit: Payer: 59

## 2023-12-05 ENCOUNTER — Ambulatory Visit (INDEPENDENT_AMBULATORY_CARE_PROVIDER_SITE_OTHER): Payer: 59

## 2023-12-05 VITALS — BP 109/75 | HR 80 | Temp 98.6°F | Resp 17

## 2023-12-05 DIAGNOSIS — R0602 Shortness of breath: Secondary | ICD-10-CM | POA: Diagnosis not present

## 2023-12-05 DIAGNOSIS — J069 Acute upper respiratory infection, unspecified: Secondary | ICD-10-CM | POA: Diagnosis not present

## 2023-12-05 MED ORDER — PREDNISONE 10 MG (21) PO TBPK: ORAL_TABLET | Freq: Every day | ORAL | 0 refills | Status: AC

## 2023-12-05 MED ORDER — FLUCONAZOLE 150 MG PO TABS
150.0000 mg | ORAL_TABLET | ORAL | 0 refills | Status: AC | PRN
Start: 2023-12-05 — End: ?

## 2023-12-05 MED ORDER — ALBUTEROL SULFATE HFA 108 (90 BASE) MCG/ACT IN AERS: 2.0000 | INHALATION_SPRAY | Freq: Four times a day (QID) | RESPIRATORY_TRACT | 0 refills | Status: AC | PRN

## 2023-12-05 MED ORDER — AMOXICILLIN-POT CLAVULANATE 875-125 MG PO TABS: 1.0000 | ORAL_TABLET | Freq: Two times a day (BID) | ORAL | 0 refills | Status: AC

## 2023-12-05 NOTE — ED Triage Notes (Addendum)
Patient reports symptoms started Thursday.  Patient reports prior to onset of symptoms had been taking tamiflu and prednisone prior to this due to an exposure to flu.  Thursday noticed feeling extremely tired.  Patient reports Friday morning woke with inability to feel left side of face and left arm.  Husband called paramedics. They said all is good. But did recommend she get evaluated   Patient has not called pcp.  Patient thinks it was a reaction to tamiflu or steroids  Denies pain   Wednesday, patient has a routine pcp appt unrelated to recent events.  Patient wants to make sure she does not have pneumonia

## 2023-12-05 NOTE — Telephone Encounter (Signed)
Reported x-ray results of viral bronchiolitis versus reactive airway disease with patient via telephone, known history of asthma, no change in treatment plan based on results, advise follow-up with PCP or urgent care if symptoms persist

## 2023-12-05 NOTE — Discharge Instructions (Addendum)
Chest x-ray is pending, you will be notified of results via telephone  Eksir symptoms have been present for 10 days without signs of resolution will provide you with bacterial coverage  Begin Augmentin every morning and every evening for 7 days to clear any bacteria contributing to symptoms, if you do have pneumonia which will be discussed via telephone, this is the current treatment recommended  Begin prednisone every morning for 6 days to help reduce sensation of pressure to the chest wall which is most likely a flare of your asthma, low suspicion that she had a reaction to prednisone as you have taken this medicine in the past without any complication  Tamiflu has now been listed as being allergy/contraindication  Inhaler has been refilled You can take Tylenol and/or Ibuprofen as needed for fever reduction and pain relief.   For cough: honey 1/2 to 1 teaspoon (you can dilute the honey in water or another fluid).  You can also use guaifenesin and dextromethorphan for cough. You can use a humidifier for chest congestion and cough.  If you don't have a humidifier, you can sit in the bathroom with the hot shower running.      For sore throat: try warm salt water gargles, cepacol lozenges, throat spray, warm tea or water with lemon/honey, popsicles or ice, or OTC cold relief medicine for throat discomfort.   For congestion: take a daily anti-histamine like Zyrtec, Claritin, and a oral decongestant, such as pseudoephedrine.  You can also use Flonase 1-2 sprays in each nostril daily.   It is important to stay hydrated: drink plenty of fluids (water, gatorade/powerade/pedialyte, juices, or teas) to keep your throat moisturized and help further relieve irritation/discomfort.

## 2023-12-05 NOTE — ED Provider Notes (Signed)
Carolyn Nelson    CSN: [id number] Arrival date & time: 12/05/23  1046      History   Chief Complaint Chief Complaint  Patient presents with   Influenza    Chest tightness - Entered by patient    HPI Carolyn Nelson is a 48 y.o. female.   Patient presents for evaluation of nasal congestion, rhinorrhea, and mild infrequent nonproductive cough and shortness of breath at rest beginning 10 days ago.  Feels that this there is something sitting on top of the chest wall.  Associated intermittent headaches.  History of asthma.  Denies wheezing.  Was prophylactically placed on Tamiflu and prednisone for influenza A exposure, 4 days ago she experienced an episode of left-sided facial and arm numbness, evaluated by EMS who determined stable, did not seek emergency department evaluation nor has followed up with PCP or notify them of episode.  Has unrelated PCP appointment in 2 days.  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    Past Surgical History:  Procedure Laterality Date   BREAST BIOPSY Left 08/03/2018   Procedure: EXCISION LEFT BREAST MASS;  Surgeon: Emelia Loron, MD;  Location: [facility];  Service: General;  Laterality: Left;   BUNIONECTOMY     CESAREAN SECTION     DILITATION & CURRETTAGE/HYSTROSCOPY WITH NOVASURE ABLATION N/A 05/29/2020   Procedure: DILATATION & CURETTAGE/HYSTEROSCOPY WITH NOVASURE ABLATION;   Surgeon: Natale Milch, MD;  Location: [facility];  Service: Gynecology;  Laterality: N/A;  105, 5.5 and 50.30 novasure defict 15    OB History     Gravida  4   Para  3   Term  3   Preterm      AB  1   Living  3      SAB  1   IAB      Ectopic      Multiple      Live Births  3            Home Medications    Prior to Admission medications   Medication Sig Start Date End Date Taking? Authorizing Provider  amoxicillin-clavulanate (AUGMENTIN) 875-125 MG tablet Take 1 tablet by mouth every 12 (twelve) hours. 12/05/23  Yes Maryah Marinaro R, NP  fluconazole (DIFLUCAN) 150 MG tablet Take 1 tablet (150 mg total) by mouth every three (3) days as needed for up to 2 doses. 12/05/23  Yes Aking Klabunde R, NP  predniSONE (STERAPRED UNI-PAK 21 TAB) 10 MG (21) TBPK tablet Take by mouth daily. Take 6 tabs by mouth daily  for 1 days, then 5 tabs for 1 days, then 4 tabs for 1 days, then 3 tabs for 1 days, 2 tabs for 1 days, then 1 tab by mouth daily for 1 days 12/05/23  Yes Jadi Deyarmin R, NP  albuterol (VENTOLIN HFA) 108 (90 Base) MCG/ACT inhaler Inhale 2 puffs into the lungs every 6 (six)  hours as needed for wheezing or shortness of breath. 12/05/23   Valinda Hoar, NP  levothyroxine (SYNTHROID) 88 MCG tablet Take 88 mcg by mouth every morning. 08/04/23   [provider]  meloxicam (MOBIC) 15 MG tablet Take 15 mg by mouth daily. Patient not taking: Reported on 12/05/2023 09/23/23   [provider]  methocarbamol (ROBAXIN) 500 MG tablet Take 500 mg by mouth 2 (two) times daily. Patient not taking: Reported on 12/05/2023 09/23/23   [provider]  omeprazole (PRILOSEC) 40 MG capsule Take 40 mg by mouth daily.    [provider]  Vitamin D, Ergocalciferol, (DRISDOL) 1.25 MG (50000 UNIT) CAPS capsule Take 50,000 Units by mouth every 14 (fourteen) days. 12/08/21   [provider]  ZEPBOUND 2.5 MG/0.5ML Pen  05/25/23   [provider]    Family History Family History  Problem Relation Age of Onset   Lung cancer Maternal Grandmother    Lung cancer Maternal Grandfather    Breast cancer Paternal Grandmother 63   Lung cancer Paternal Grandmother    Diabetes Paternal Grandfather     Social History Social History   Tobacco Use   Smoking status: Former    Current packs/day: 0.25    Types: Cigarettes   Smokeless tobacco: Never   Tobacco comments:    quit 07/2017  Vaping Use   Vaping status: Never Used  Substance Use Topics   Alcohol use: Yes    Alcohol/week: 1.0 standard drink of alcohol    Types: 1 Glasses of wine per week    Comment: soc   Drug use: No     Allergies   Tamiflu [oseltamivir]   Review of Systems Review of Systems   Physical Exam Triage Vital Signs ED Triage Vitals  Encounter Vitals Group     BP 12/05/23 1132 109/75     Systolic BP Percentile --      Diastolic BP Percentile --      Pulse Rate 12/05/23 1132 80     Resp 12/05/23 1132 17     Temp 12/05/23 1132 98.6 F (37 C)     Temp Source 12/05/23 1132 Oral     SpO2 12/05/23 1132 98 %     Weight --      Height --      Head Circumference --      Peak Flow --      Pain Score 12/05/23 1149 0     Pain Loc --      Pain Education --      Exclude from Growth Chart --    No data found.  Updated Vital Signs BP 109/75 (BP Location: Left Arm)   Pulse 80   Temp 98.6 F (37 C) (Oral)   Resp 17   LMP 11/09/2023 Comment: reports she has been bleeding for the entire month of january  SpO2 98%   Visual Acuity Right Eye Distance:   Left Eye Distance:   Bilateral Distance:    Right Eye Near:   Left Eye Near:    Bilateral Near:     Physical Exam Constitutional:      Appearance: Normal appearance.  HENT:     Head: Normocephalic.     Right Ear: Tympanic membrane, ear canal and external ear normal.     Left Ear: Tympanic membrane, ear canal and external ear normal.     Nose: Congestion present. No rhinorrhea.      Mouth/Throat:     Pharynx: No  oropharyngeal exudate or posterior oropharyngeal erythema.  Eyes:     Extraocular Movements: Extraocular movements intact.  Cardiovascular:     Rate and Rhythm: Normal rate and regular rhythm.     Pulses: Normal pulses.     Heart sounds: Normal heart sounds.  Pulmonary:     Effort: Pulmonary effort is normal.     Breath sounds: Normal breath sounds.  Musculoskeletal:     Cervical back: Normal range of motion and neck supple.  Neurological:     Mental Status: She is alert and oriented to person, place, and time. Mental status is at baseline.      UC Treatments / Results  Labs (all labs ordered are listed, but only abnormal results are displayed) Labs Reviewed - No data to display  EKG   Radiology DG Chest 2 View Result Date: 12/05/2023 CLINICAL DATA:  Sob Sob x 4 days. No wheezing or coughing. Pt states it feels like something is sitting on her chest. Hx of asthma and prediabetes. No hx of high blood pressure. Former smoker. No previous surgery. EXAM: CHEST - 2 VIEW COMPARISON:  None Available. FINDINGS: The heart and mediastinal contours are within normal limits. No focal consolidation. Prominent interstitial markings with no definite pulmonary edema. No pleural effusion. No pneumothorax. No acute osseous abnormality. IMPRESSION: Findings suggestive of viral bronchiolitis versus reactive airway disease. Electronically Signed   By: Tish Frederickson M.D.   On: 12/05/2023 12:45    Procedures Procedures (including critical care time)  Medications Ordered in UC Medications - No data to display  Initial Impression / Assessment and Plan / UC Course  I have reviewed the triage vital signs and the nursing notes.  Pertinent labs & imaging results that were available during my care of the patient were reviewed by me and considered in my medical decision making (see chart for details).  Acute URI, shortness of breath  Patient is in no signs of  distress nor toxic appearing.  Vital signs are stable.  Low suspicion for pneumonia, pneumothorax or bronchitis and therefore will defer imaging.  X-ray pending, will notify patient of results via telephone.  Symptoms present for 10 days will provide bacterial coverage, prescribed Augmentin, prednisone taper, Diflucan prophylactically and refilled albuterol.  Low suspicion for facial and arm numbness to be related to steroids that she is taken in the past without complication, Tamiflu only outlier, has been added to contradiction list. May use additional over-the-counter medications as needed for supportive care.  May follow-up with urgent care as needed if symptoms persist or worsen.  Final Clinical Impressions(s) / UC Diagnoses   Final diagnoses:  Shortness of breath  Acute URI     Discharge Instructions      Chest x-ray is pending, you will be notified of results via telephone  Eksir symptoms have been present for 10 days without signs of resolution will provide you with bacterial coverage  Begin Augmentin every morning and every evening for 7 days to clear any bacteria contributing to symptoms, if you do have pneumonia which will be discussed via telephone, this is the current treatment recommended  Begin prednisone every morning for 6 days to help reduce sensation of pressure to the chest wall which is most likely a flare of your asthma, low suspicion that she had a reaction to prednisone as you have taken this medicine in the past without any complication  Tamiflu has now been listed as being allergy/contraindication  Inhaler has been refilled You can take Tylenol  and/or Ibuprofen as needed for fever reduction and pain relief.   For cough: honey 1/2 to 1 teaspoon (you can dilute the honey in water or another fluid).  You can also use guaifenesin and dextromethorphan for cough. You can use a humidifier for chest congestion and cough.  If you don't have a humidifier, you can sit in the  bathroom with the hot shower running.      For sore throat: try warm salt water gargles, cepacol lozenges, throat spray, warm tea or water with lemon/honey, popsicles or ice, or OTC cold relief medicine for throat discomfort.   For congestion: take a daily anti-histamine like Zyrtec, Claritin, and a oral decongestant, such as pseudoephedrine.  You can also use Flonase 1-2 sprays in each nostril daily.   It is important to stay hydrated: drink plenty of fluids (water, gatorade/powerade/pedialyte, juices, or teas) to keep your throat moisturized and help further relieve irritation/discomfort.    ED Prescriptions     Medication Sig Dispense Auth. Provider   albuterol (VENTOLIN HFA) 108 (90 Base) MCG/ACT inhaler Inhale 2 puffs into the lungs every 6 (six) hours as needed for wheezing or shortness of breath. 18 g Naia Ruff R, NP   predniSONE (STERAPRED UNI-PAK 21 TAB) 10 MG (21) TBPK tablet Take by mouth daily. Take 6 tabs by mouth daily  for 1 days, then 5 tabs for 1 days, then 4 tabs for 1 days, then 3 tabs for 1 days, 2 tabs for 1 days, then 1 tab by mouth daily for 1 days 21 tablet Jackqueline Aquilar R, NP   amoxicillin-clavulanate (AUGMENTIN) 875-125 MG tablet Take 1 tablet by mouth every 12 (twelve) hours. 14 tablet Jocelyn Lowery R, NP   fluconazole (DIFLUCAN) 150 MG tablet Take 1 tablet (150 mg total) by mouth every three (3) days as needed for up to 2 doses. 2 tablet Valinda Hoar, NP      PDMP not reviewed this encounter.   Valinda Hoar, NP 12/05/23 1251

## 2023-12-13 ENCOUNTER — Telehealth: Payer: Self-pay

## 2023-12-13 NOTE — Telephone Encounter (Signed)
 Patient states having abnormal bleeding starting on Date of onset: 11/03/24  for 6weeks. Patient states the bleeding is moderate.  Patient states mild abdominal pain and cramping Patient states has not had started new birth control within the last 3 months. She advised she has been the sickest she has ever been since Christmas. She has had a sinus infection, RSV and Norovirus.   Patientis not pregnant    Patient  has not had any recent surgeries.   UTI symptoms?  No   UTI protocol   PLAN: -If soaking a pad every 30 minutes to an hour or soiling clothes; appt if provider is in office or go to ED **notify provider If bleeding persists, schedule appt.

## 2023-12-13 NOTE — Telephone Encounter (Signed)
TRIAGE VOICEMAIL: Patient states she has been on her period for 6 weeks. She states she had a pap smear in December. Requesting return call.

## 2024-01-22 ENCOUNTER — Encounter: Payer: Self-pay | Admitting: Obstetrics and Gynecology

## 2024-01-22 LAB — COLOGUARD: COLOGUARD: NEGATIVE

## 2024-03-09 ENCOUNTER — Ambulatory Visit
Admission: RE | Admit: 2024-03-09 | Discharge: 2024-03-09 | Disposition: A | Discharge status: Home / Self Care | Source: Ambulatory Visit | Attending: Obstetrics and Gynecology | Admitting: Obstetrics and Gynecology

## 2024-03-09 DIAGNOSIS — Z1231 Encounter for screening mammogram for malignant neoplasm of breast: Secondary | ICD-10-CM | POA: Diagnosis present

## 2024-03-13 ENCOUNTER — Encounter: Payer: Self-pay | Admitting: Obstetrics and Gynecology
